# Patient Record
Sex: Male | Born: 1955 | Race: White | Hispanic: No | Marital: Married | Smoking: Former smoker
Health system: Southern US, Community
[De-identification: ages and names within clinical notes are randomized; demographics above are authoritative.]

## PROBLEM LIST (undated history)

## (undated) DIAGNOSIS — I219 Acute myocardial infarction, unspecified: Secondary | ICD-10-CM

---

## 1998-10-25 ENCOUNTER — Encounter: Admission: RE | Admit: 1998-10-25 | Discharge: 1998-11-09 | Payer: Self-pay | Admitting: Family Medicine

## 1999-06-08 ENCOUNTER — Encounter: Payer: Self-pay | Admitting: Emergency Medicine

## 1999-06-08 ENCOUNTER — Emergency Department (HOSPITAL_COMMUNITY): Admission: EM | Admit: 1999-06-08 | Discharge: 1999-06-08 | Payer: Self-pay | Admitting: Emergency Medicine

## 1999-06-29 ENCOUNTER — Ambulatory Visit (HOSPITAL_COMMUNITY): Admission: RE | Admit: 1999-06-29 | Discharge: 1999-06-30 | Payer: Self-pay | Admitting: *Deleted

## 2001-06-27 ENCOUNTER — Encounter: Payer: Self-pay | Admitting: Family Medicine

## 2001-06-27 ENCOUNTER — Encounter: Admission: RE | Admit: 2001-06-27 | Discharge: 2001-06-27 | Payer: Self-pay | Admitting: Family Medicine

## 2001-07-21 ENCOUNTER — Ambulatory Visit (HOSPITAL_COMMUNITY): Admission: RE | Admit: 2001-07-21 | Discharge: 2001-07-22 | Payer: Self-pay | Admitting: Neurosurgery

## 2001-08-19 ENCOUNTER — Encounter: Admission: RE | Admit: 2001-08-19 | Discharge: 2001-08-19 | Payer: Self-pay | Admitting: Neurosurgery

## 2003-03-26 ENCOUNTER — Ambulatory Visit (HOSPITAL_COMMUNITY): Admission: RE | Admit: 2003-03-26 | Discharge: 2003-03-26 | Payer: Self-pay | Admitting: Gastroenterology

## 2003-03-26 ENCOUNTER — Encounter (INDEPENDENT_AMBULATORY_CARE_PROVIDER_SITE_OTHER): Payer: Self-pay | Admitting: *Deleted

## 2007-11-06 ENCOUNTER — Encounter (INDEPENDENT_AMBULATORY_CARE_PROVIDER_SITE_OTHER): Payer: Self-pay | Admitting: *Deleted

## 2007-12-30 ENCOUNTER — Ambulatory Visit: Payer: Self-pay | Admitting: Family Medicine

## 2007-12-30 DIAGNOSIS — F528 Other sexual dysfunction not due to a substance or known physiological condition: Secondary | ICD-10-CM | POA: Insufficient documentation

## 2007-12-30 DIAGNOSIS — R079 Chest pain, unspecified: Secondary | ICD-10-CM

## 2007-12-30 DIAGNOSIS — Z860100 Personal history of colon polyps, unspecified: Secondary | ICD-10-CM | POA: Insufficient documentation

## 2007-12-31 ENCOUNTER — Ambulatory Visit: Payer: Self-pay | Admitting: Family Medicine

## 2007-12-31 ENCOUNTER — Telehealth (INDEPENDENT_AMBULATORY_CARE_PROVIDER_SITE_OTHER): Payer: Self-pay | Admitting: *Deleted

## 2008-01-05 ENCOUNTER — Telehealth (INDEPENDENT_AMBULATORY_CARE_PROVIDER_SITE_OTHER): Payer: Self-pay | Admitting: *Deleted

## 2008-01-05 LAB — CONVERTED CEMR LAB
ALT: 21 units/L (ref 0–53)
AST: 17 units/L (ref 0–37)
Albumin: 4 g/dL (ref 3.5–5.2)
Alkaline Phosphatase: 78 units/L (ref 39–117)
BUN: 14 mg/dL (ref 6–23)
Basophils Absolute: 0 10*3/uL (ref 0.0–0.1)
Basophils Relative: 0.3 % (ref 0.0–1.0)
Bilirubin, Direct: 0.1 mg/dL (ref 0.0–0.3)
CO2: 28 meq/L (ref 19–32)
Calcium: 9.2 mg/dL (ref 8.4–10.5)
Chloride: 109 meq/L (ref 96–112)
Cholesterol: 196 mg/dL (ref 0–200)
Creatinine, Ser: 0.9 mg/dL (ref 0.4–1.5)
Eosinophils Absolute: 0.3 10*3/uL (ref 0.0–0.7)
Eosinophils Relative: 3.9 % (ref 0.0–5.0)
GFR calc Af Amer: 114 mL/min
GFR calc non Af Amer: 94 mL/min
Glucose, Bld: 104 mg/dL — ABNORMAL HIGH (ref 70–99)
HCT: 47 % (ref 39.0–52.0)
HDL: 29.9 mg/dL — ABNORMAL LOW (ref >39.0)
Hemoglobin: 15.6 g/dL (ref 13.0–17.0)
LDL Cholesterol: 147 mg/dL — ABNORMAL HIGH (ref 0–99)
Lymphocytes Relative: 40.7 % (ref 12.0–46.0)
MCHC: 33.1 g/dL (ref 30.0–36.0)
MCV: 91.6 fL (ref 78.0–100.0)
Monocytes Absolute: 0.6 10*3/uL (ref 0.1–1.0)
Monocytes Relative: 7.3 % (ref 3.0–12.0)
Neutro Abs: 4 10*3/uL (ref 1.4–7.7)
Neutrophils Relative %: 47.8 % (ref 43.0–77.0)
PSA: 0.2 ng/mL (ref 0.10–4.00)
Platelets: 272 10*3/uL (ref 150–400)
Potassium: 4.2 meq/L (ref 3.5–5.1)
RBC: 5.13 M/uL (ref 4.22–5.81)
RDW: 12.8 % (ref 11.5–14.6)
Sodium: 142 meq/L (ref 135–145)
TSH: 1.41 microintl units/mL (ref 0.35–5.50)
Total Bilirubin: 1 mg/dL (ref 0.3–1.2)
Total CHOL/HDL Ratio: 6.6
Total Protein: 7 g/dL (ref 6.0–8.3)
Triglycerides: 96 mg/dL (ref 0–149)
VLDL: 19 mg/dL (ref 0–40)
WBC: 8.3 10*3/uL (ref 4.5–10.5)

## 2008-04-12 ENCOUNTER — Ambulatory Visit: Payer: Self-pay | Admitting: Family Medicine

## 2008-04-12 DIAGNOSIS — E785 Hyperlipidemia, unspecified: Secondary | ICD-10-CM | POA: Insufficient documentation

## 2008-04-22 ENCOUNTER — Encounter (INDEPENDENT_AMBULATORY_CARE_PROVIDER_SITE_OTHER): Payer: Self-pay | Admitting: *Deleted

## 2008-04-22 LAB — CONVERTED CEMR LAB
Bilirubin, Direct: 0.1 mg/dL (ref 0.0–0.3)
Calcium: 9.3 mg/dL (ref 8.4–10.5)
GFR calc Af Amer: 90 mL/min
GFR calc non Af Amer: 74 mL/min
HDL: 29.4 mg/dL — ABNORMAL LOW (ref >39.0)
Sodium: 141 meq/L (ref 135–145)
Total Bilirubin: 1.4 mg/dL — ABNORMAL HIGH (ref 0.3–1.2)
Total CHOL/HDL Ratio: 7.1
Triglycerides: 130 mg/dL (ref 0–149)

## 2008-05-03 ENCOUNTER — Emergency Department (HOSPITAL_COMMUNITY): Admission: EM | Admit: 2008-05-03 | Discharge: 2008-05-03 | Payer: Self-pay | Admitting: Emergency Medicine

## 2008-05-13 ENCOUNTER — Emergency Department (HOSPITAL_COMMUNITY): Admission: EM | Admit: 2008-05-13 | Discharge: 2008-05-13 | Payer: Self-pay | Admitting: Emergency Medicine

## 2008-09-21 ENCOUNTER — Encounter: Payer: Self-pay | Admitting: Family Medicine

## 2009-05-14 IMAGING — CR DG CHEST 2V
3 series · 3 of 3 positions shown · non-contrast
Comparison: None.

CLINICAL DATA: Left rib pain, history of smoking

CHEST - 2 VIEW

[view not recorded (1 of 3)]
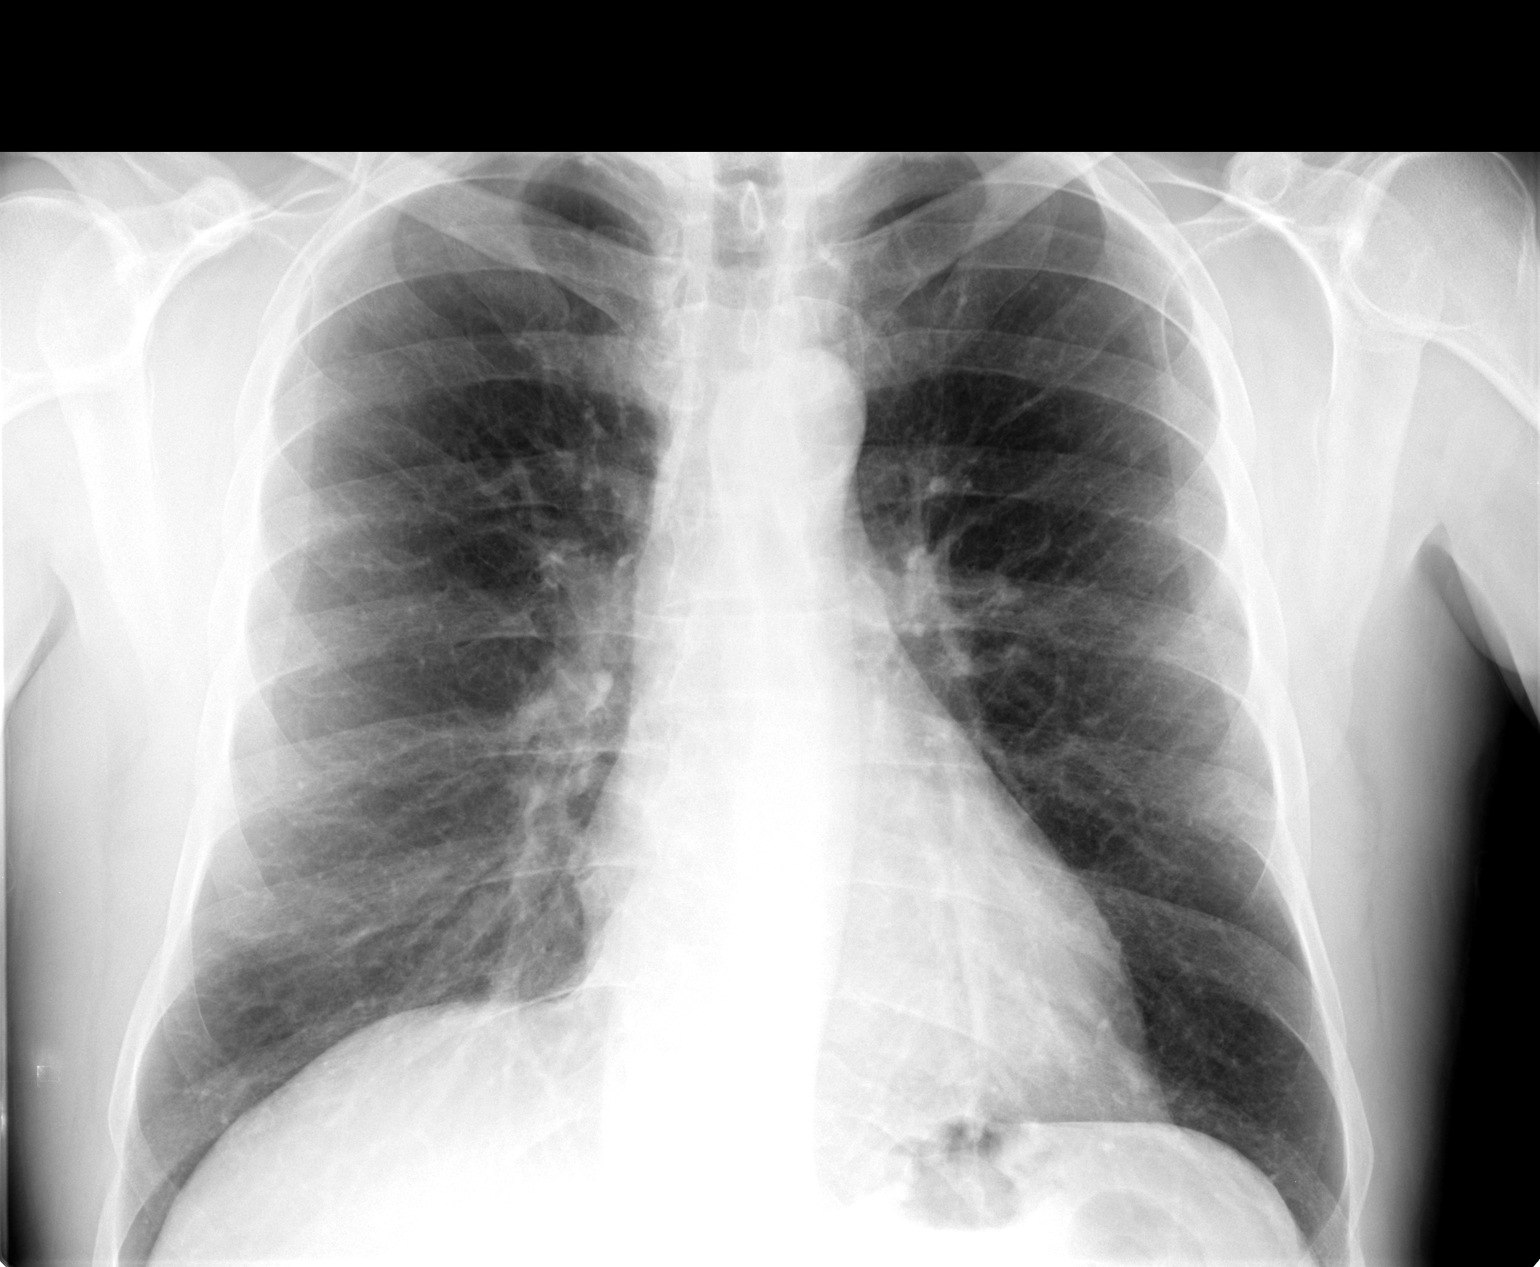

[view not recorded (2 of 3)]
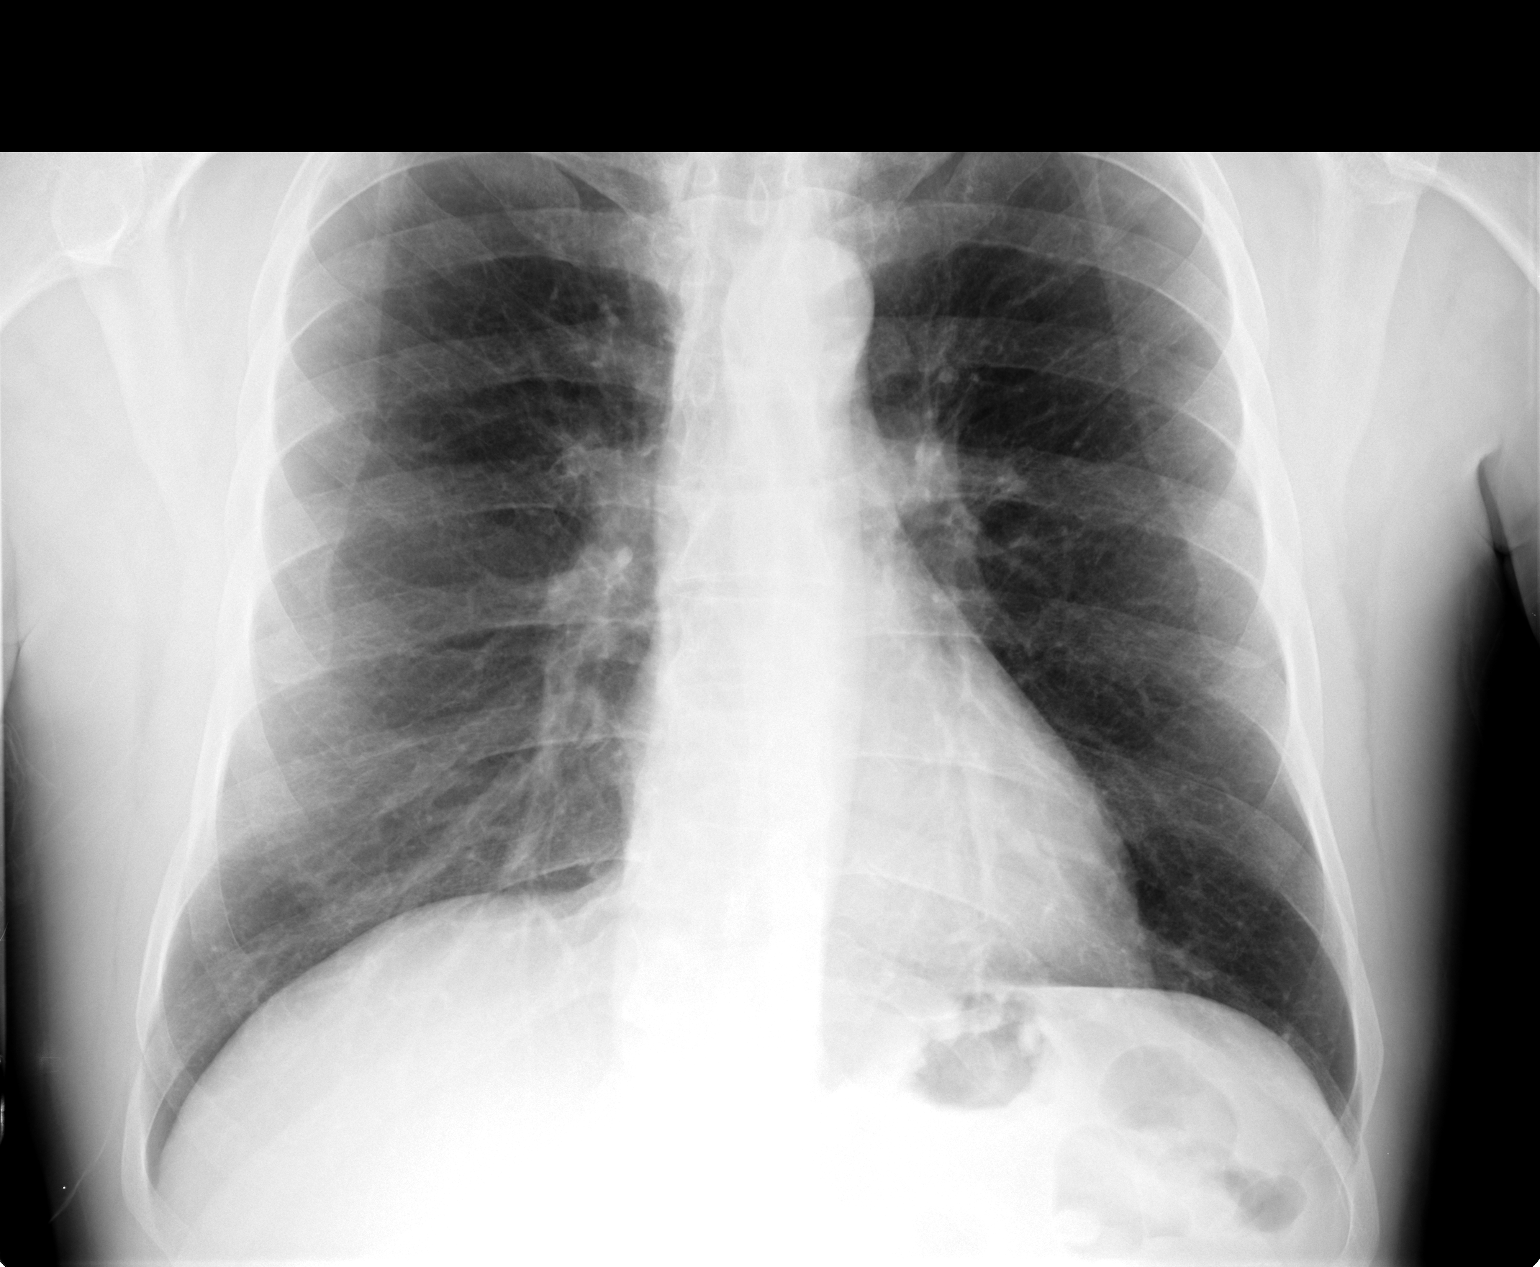

[view not recorded (3 of 3)]
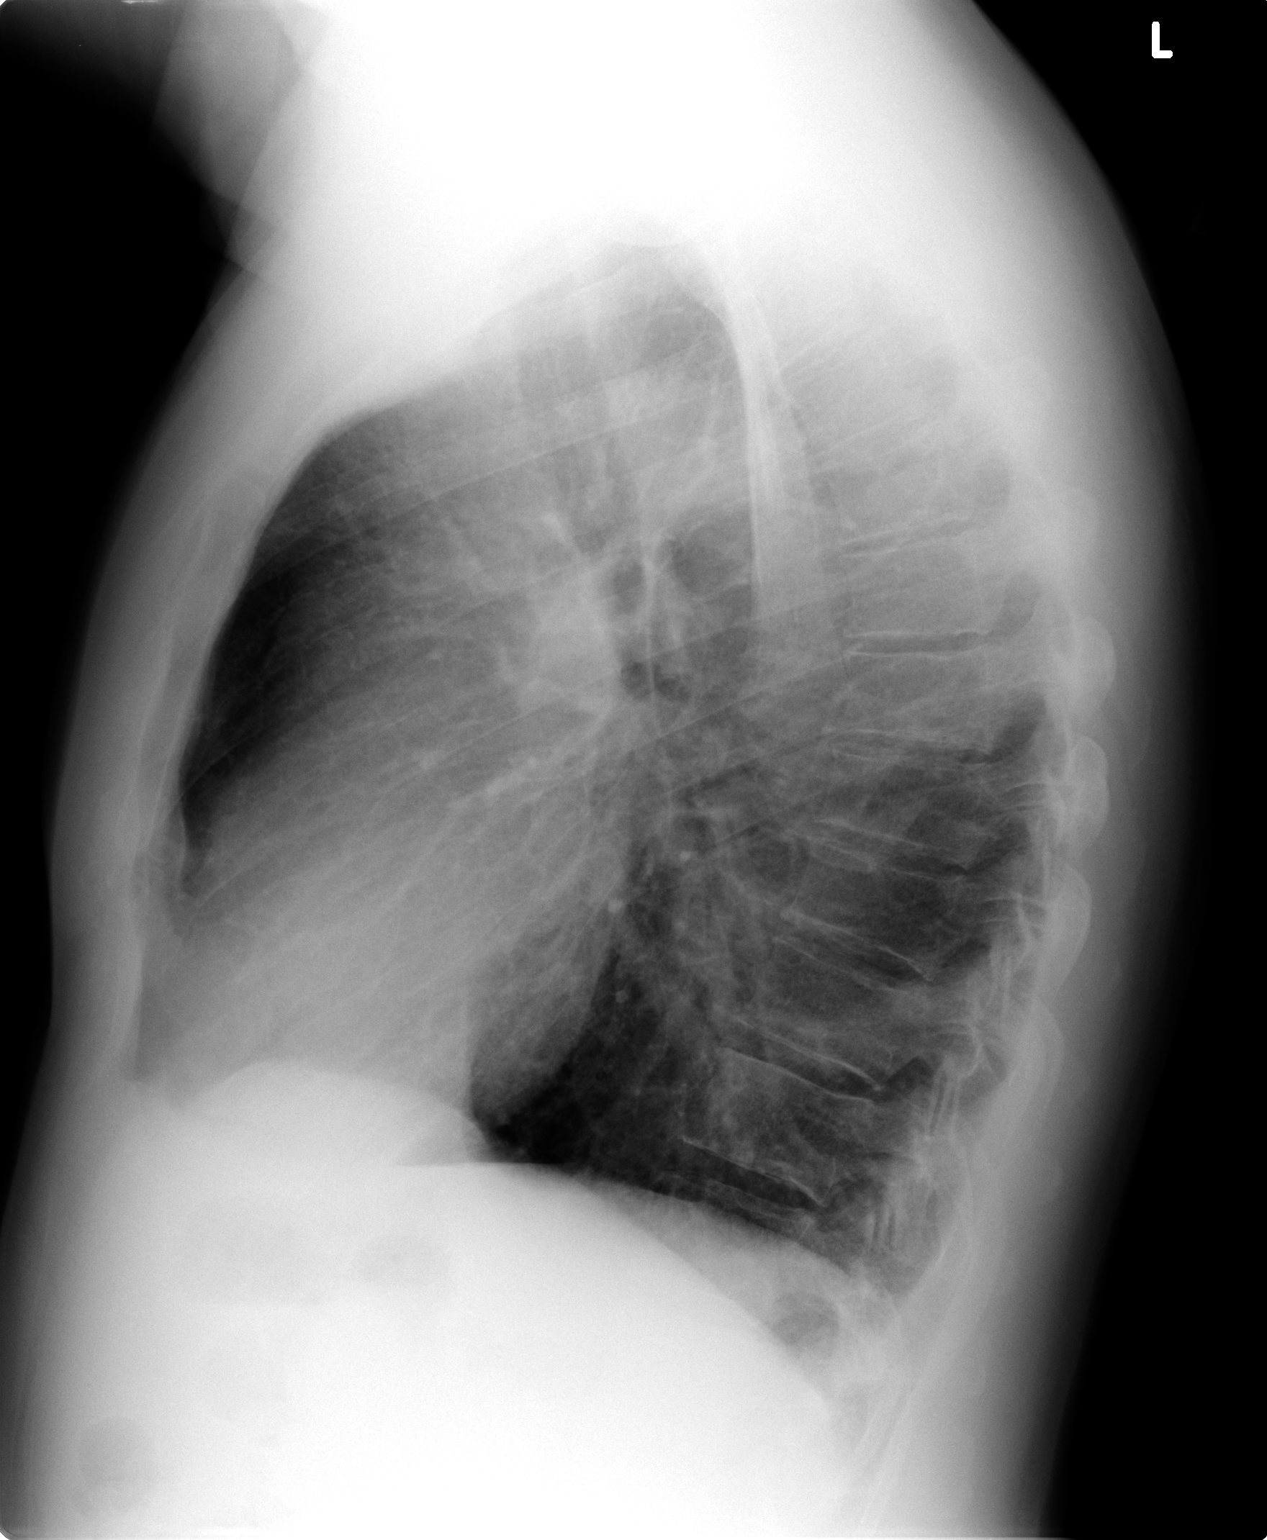

[3 of 3 positions shown; findings below may reference images not displayed]

FINDINGS: Normal heart size and vascularity.  No acute pneumonia,
consolidation, collapse, airspace process, edema, effusion or
pneumothorax.  Trachea is midline.  Lower cervical fusion hardware
is partially imaged.  No subcutaneous air.  Intact bony thorax.
IMPRESSION: No acute chest findings.

## 2011-02-09 NOTE — Op Note (Signed)
[place]  Patient:    Devon Short, Devon Short Visit Number: [id number] MRN: [id number]          Service Type: SUR Location: [id number] Attending Physician:  Cristi Loron Dictated by:   Cristi Loron, M.D. Proc. Date: 07/21/01 Admit Date:  07/21/2001                             Operative Report  PREOPERATIVE DIAGNOSES:  C5-6 spondylosis, degenerative disk disease, spinal stenosis, cervical radiculopathy, cervicalgia.  POSTOPERATIVE DIAGNOSES:  C5-6 spondylosis, degenerative disk disease, spinal stenosis, cervical radiculopathy, cervicalgia.  PROCEDURES:  C5-6 extensive anterior cervical diskectomy, interbody iliac crest allograft arthrodesis, anterior cervical plating (Codman titanium plate and screws).  SURGEON:  Cristi Loron, M.D.  ASSISTANT:  Hewitt Shorts, M.D.  ANESTHESIA:  General endotracheal.  ESTIMATED BLOOD LOSS:  100 cc.  SPECIMENS:  None.  DRAINS:  None.  COMPLICATIONS:  None.  BRIEF HISTORY:  The patient is a 55 year old white male who has had chronic neck pain.  He began having more severe pain over the last several months, and he failed medical management and was worked up as an outpatient with a cervical MRI, which demonstrated significant cervical spondylosis at C5-6. The patient therefore weighed the risks, benefits, and alternatives of surgery and decided to proceed with an anterior cervical diskectomy and fusion and plating at C5-6.  DESCRIPTION OF PROCEDURE:  The patient was brought to the operating room by the anesthesia team.  General endotracheal anesthesia was induced.  The patient remained in supine position.  A roll was placed under his shoulders to place the neck in slight extension.  His anterior cervical region was then shaved and prepared with Betadine scrub and Betadine solution, and sterile drapes were applied.  I then injected the area to be incised with Marcaine with  epinephrine solution and used a scalpel to make a transverse incision in the patients left anterior neck.  I used the Metzenbaum scissors to divide the platysma muscle and divide medial to the sternocleidomastoid muscle, jugular vein, and carotid artery.  I bluntly dissected down toward the anterior cervical spine, identified the esophagus, and carefully retracted it medially.  I cleared the soft tissue from the anterior cervical spine using Kitner swabs and inserted a bent spinal needle into the exposed interspace.  I then obtained the intraoperative radiograph to confirm my location.  The needle was in the C5-6.  I then removed the needle and used electrocautery to detach the medial border of the longus colli muscle bilaterally from the C5-6 interspace.  I inserted the Caspar self-retaining retractor for exposure and then used a 15 blade scalpel to incise the C5-6 intervertebral disk.  The disk space was quite collapsed.  I then inserted distraction screws at C5 and C6 and distracted the interspace, and using pituitary forceps and Karlin curettes I performed a partial diskectomy at C5-6.  I used the high-speed drill to decorticate the vertebral end plates at Y7-8 and drill away the remainder of the intervertebral disk.  There were large osteophytes coming from the vertebral end plates.  I drilled them away with the drill.  I thinned out the posterior longitudinal ligament with the drill and incised it with the arachnoid knife and removed it with the Kerrison punch, undercutting the vertebral end plates at G9-5, decompressing the thecal sac.  I performed a generous foraminotomy about the bilateral C6 nerve root.  Of note, on the left side there was a large bone spur compressing the left C6 nerve root.  The right nerve root was not quite as compressed.  I got a good decompression of the thecal sac and the bilateral C6 nerve roots.  I now turned my attention to the arthrodesis.  I obtained  iliac crest tricortical allograft bone graft and fashioned it to these approximate dimensions:  8 mm in height, 1 cm in depth.  I inserted it into the distracted C5-6 interspace and then removed the distraction screws.  There was good, snug fit of the bone graft in the interspace.  I now turned my attention to the anterior spinal instrumentation.  I obtained the appropriate length Codman anterior cervical plate, laid it along the vertebral bodies from C5 to C6, drilled two holes at C5 and two at C6, tapped the holes, and secured the plate to the vertebral bodies with 15 mm screws, i.e., two at each vertebral body.  I then obtained the intraoperative radiograph that demonstrated good position and depth of screws.  I could not see the lower screws because of the patients shoulders, but they looked good in vivo.  I therefore secured the screws to the plate using the cam tightener at each screw.  I then achieved stringent hemostasis using bipolar electrocautery and Gelfoam.  I then copiously irrigated out the wound with bacitracin solution, removed the solution, and then removed the Caspar self-retaining retractor.  I inspected the esophagus for any damage, and there was none apparent.  I then reapproximated the patients platysma muscle with interrupted 3-0 Vicryl suture, the subcutaneous tissue with interrupted 3-0 Vicryl suture, and the skin with Steri-Strips and benzoin.  The wound was then covered with bacitracin ointment and a sterile dressing applied, the drapes were removed, and the patient was subsequently extubated by the anesthesia team and transported to the postanesthesia care unit in stable condition.  All sponge, instrument, and needle counts were correct at the end of the case. Dictated by:   Cristi Loron, M.D. Attending Physician:  Tressie Stalker D DD:  07/21/01 TD:  07/22/01 Job: [id number]

## 2011-02-09 NOTE — Op Note (Signed)
   NAME:  Devon Short, Devon Short                        ACCOUNT NO.:  [id number]   MEDICAL RECORD NO.:  [id number]                   PATIENT TYPE:  AMB   LOCATION:  ENDO                                 FACILITY:  [facility]   PHYSICIAN:  Anselmo Rod, M.D.               DATE OF BIRTH:  1956-04-23   DATE OF PROCEDURE:  03/26/2003  DATE OF DISCHARGE:                                 OPERATIVE REPORT   PROCEDURE PERFORMED:  Colonoscopy with biopsies.   ENDOSCOPIST:  Anselmo Rod, M.D.   INSTRUMENT USED:  Olympus video colonoscope.   INDICATION FOR PROCEDURE:  A 55 year old white male with a history of rectal  bleeding for the last six months.  Rule out colonic polyps, masses,  hemorrhoids, etc.   PREPROCEDURE PREPARATION:  Informed consent was procured from the patient.  The patient had fasted for eight hours prior to the procedure and prepped  with a bottle of magnesium citrate and a gallon of GoLYTELY the night prior  to the procedure.   PREPROCEDURE PHYSICAL:  VITAL SIGNS:  The patient had stable vital signs.  NECK:  Supple.  CHEST:  Clear to auscultation.  S1, S2 regular.  ABDOMEN:  Soft with normal bowel sounds.   DESCRIPTION OF PROCEDURE:  The patient was placed in the left lateral  decubitus position and sedated with 80 mg of Demerol and 10 mg of Versed  intravenously.  Once the patient adequately sedate and maintained on low-  flow oxygen and continuous cardiac monitoring, the Olympus video colonoscope  was advanced from the rectum to the cecum.  The appendiceal orifice and the  ileocecal valve were clearly visualized and photographed.  The terminal  ileum appeared normal.  A few small sessile polyps were biopsied from the  rectosigmoid area.  Small internal hemorrhoids were seen on retroflexion,  and the rest of the exam was essentially normal.   IMPRESSION:  1. Small, nonbleeding internal hemorrhoids.  2. A few sessile polyps biopsied from the rectosigmoid area.  3.  Otherwise normal colonoscopy up to the terminal ileum.   RECOMMENDATIONS:  1. Await pathology results.  2.     Avoid all nonsteroidals including aspirin for the next four weeks.  3. A high-fiber diet with liberal fluid intake.  4. Outpatient follow-up in the next two weeks for further recommendations.                                               Anselmo Rod, M.D.    JNM/MEDQ  D:  03/26/2003  T:  03/27/2003  Job:  [id number]   cc:   Gabriel Earing, M.D.  [address]  [place]  [place] [address]  Fax: [phone]

## 2020-01-05 ENCOUNTER — Other Ambulatory Visit: Payer: Self-pay

## 2020-01-05 ENCOUNTER — Emergency Department (INDEPENDENT_AMBULATORY_CARE_PROVIDER_SITE_OTHER): Payer: BC Managed Care – PPO

## 2020-01-05 ENCOUNTER — Emergency Department (INDEPENDENT_AMBULATORY_CARE_PROVIDER_SITE_OTHER)
Admission: EM | Admit: 2020-01-05 | Discharge: 2020-01-05 | Disposition: A | Discharge status: Home / Self Care | Payer: BC Managed Care – PPO | Source: Home / Self Care

## 2020-01-05 ENCOUNTER — Encounter: Payer: Self-pay | Admitting: Emergency Medicine

## 2020-01-05 DIAGNOSIS — J209 Acute bronchitis, unspecified: Secondary | ICD-10-CM

## 2020-01-05 HISTORY — DX: Acute myocardial infarction, unspecified: I21.9

## 2020-01-05 MED ORDER — AZITHROMYCIN 250 MG PO TABS: 250.0000 mg | ORAL_TABLET | Freq: Every day | ORAL | 0 refills | Status: DC

## 2020-01-05 MED ORDER — BENZONATATE 100 MG PO CAPS: 100.0000 mg | ORAL_CAPSULE | Freq: Three times a day (TID) | ORAL | 0 refills | Status: DC

## 2020-01-05 NOTE — ED Provider Notes (Addendum)
Devon Short CARE    CSN: [id number] Arrival date & time: 01/05/20  1811      History   Chief Complaint Chief Complaint  Patient presents with  . Cough    HPI [name] is a 64 y.o. male.   HPI Devon Short is a 64 y.o. male presenting to UC with c/o 1 week gradually worsening productive cough with thick green mucous.  Mild chest soreness, body aches and fatigue.  He had a negative Covid test a few days ago.  Denies known sick contacts or recent travel.  No known fever.  No SOB. Denies n/v/d.  BP elevated, hx of same. Last BP with cardiologist 1 month ago was 178/91. Pt's BP medication was recently changed. O2 Sat 94% today, it was 95% about 1 month ago with his cardiologist per [place].   Past Medical History:  Diagnosis Date  . Heart attack [facility])     Patient Active Problem List   Diagnosis Date Noted  . OTHER AND UNSPECIFIED HYPERLIPIDEMIA 04/12/2008  . ERECTILE DYSFUNCTION 12/30/2007  . RIB PAIN, LEFT SIDED 12/30/2007  . COLONIC POLYPS, BENIGN, HX OF 12/30/2007    History reviewed. No pertinent surgical history.    Home Medications    Prior to Admission medications   Medication Sig Start Date End Date Taking? Authorizing Provider  aspirin 81 MG chewable tablet Chew by mouth daily.   Yes [provider]  atorvastatin (LIPITOR) 40 MG tablet Take 40 mg by mouth daily.   Yes [provider]  carvedilol (COREG) 12.5 MG tablet Take 12.5 mg by mouth 2 (two) times daily with a meal.   Yes [provider]  isosorbide mononitrate (IMDUR) 60 MG 24 hr tablet Take 60 mg by mouth daily.   Yes [provider]  azithromycin (ZITHROMAX) 250 MG tablet Take 1 tablet (250 mg total) by mouth daily. Take first 2 tablets together, then 1 every day until finished. 01/05/20   Noe Gens, PA-C  benzonatate (TESSALON) 100 MG capsule Take 1-2 capsules (100-200 mg total) by mouth every 8 (eight) hours. 01/05/20   Noe Gens,  PA-C    Family History Family History  Problem Relation Age of Onset  . Cancer Father     Social History Social History   Tobacco Use  . Smoking status: Former Smoker    Years: 45.00    Types: Cigarettes    Quit date: 01/04/2018    Years since quitting: 2.0  . Smokeless tobacco: Never Used  Substance Use Topics  . Alcohol use: Not Currently  . Drug use: Never     Allergies   Patient has no known allergies.   Review of Systems Review of Systems  Constitutional: Positive for fatigue. Negative for chills and fever.  HENT: Positive for congestion. Negative for ear pain, sore throat, trouble swallowing and voice change.   Respiratory: Positive for cough. Negative for shortness of breath.   Cardiovascular: Negative for chest pain and palpitations.  Gastrointestinal: Negative for abdominal pain, diarrhea, nausea and vomiting.  Musculoskeletal: Positive for arthralgias, back pain and myalgias.  Skin: Negative for rash.  Neurological: Positive for headaches. Negative for dizziness and light-headedness.  All other systems reviewed and are negative.    Physical Exam Triage Vital Signs ED Triage Vitals  Enc Vitals Group     BP 01/05/20 1825 (!) 192/105     Pulse Rate 01/05/20 1825 (!) 53     Resp 01/05/20 1825 10  Temp 01/05/20 1825 98.4 F (36.9 C)     Temp Source 01/05/20 1825 Oral     SpO2 01/05/20 1825 94 %     Weight 01/05/20 1827 202 lb (91.6 kg)     Height 01/05/20 1827 5\' 11"  (1.803 m)     Head Circumference --      Peak Flow --      Pain Score 01/05/20 1826 2     Pain Loc --      Pain Edu? --      Excl. in GC? --    No data found.  Updated Vital Signs BP (!) 192/105 (BP Location: Right Arm)   Pulse (!) 53   Temp 98.4 F (36.9 C) (Oral)   Resp 10   Ht 5\' 11"  (1.803 m)   Wt 202 lb (91.6 kg)   SpO2 94%   BMI 28.17 kg/m   Visual Acuity Right Eye Distance:   Left Eye Distance:   Bilateral Distance:    Right Eye Near:   Left Eye Near:      Bilateral Near:     Physical Exam Vitals and nursing note reviewed.  Constitutional:      Appearance: Normal appearance. He is well-developed.  HENT:     Head: Normocephalic and atraumatic.     Right Ear: Tympanic membrane and ear canal normal.     Left Ear: Tympanic membrane and ear canal normal.     Nose: Nose normal.     Right Sinus: No maxillary sinus tenderness or frontal sinus tenderness.     Left Sinus: No maxillary sinus tenderness or frontal sinus tenderness.     Mouth/Throat:     Lips: Pink.     Mouth: Mucous membranes are moist.     Pharynx: Oropharynx is clear. Uvula midline.  Cardiovascular:     Rate and Rhythm: Normal rate and regular rhythm.  Pulmonary:     Effort: Pulmonary effort is normal. No respiratory distress.     Breath sounds: No stridor. Wheezing ( in lower lung fields) and rhonchi present.  Musculoskeletal:        General: Normal range of motion.     Cervical back: Normal range of motion.  Skin:    General: Skin is warm and dry.  Neurological:     Mental Status: He is alert and oriented to person, place, and time.  Psychiatric:        Behavior: Behavior normal.      UC Treatments / Results  Labs (all labs ordered are listed, but only abnormal results are displayed) Labs Reviewed - No data to display  EKG   Radiology .CLINICAL DATA:  Cough, congestion. Additional history provided: Patient reports cough productive of green sputum and shortness of breath for 1 week.  EXAM: CHEST - 2 VIEW  COMPARISON:  Chest radiograph 12/31/2007  FINDINGS: Heart size within normal limits. Aortic atherosclerosis. Minimal linear atelectasis within the lung bases. No definite airspace consolidation. No evidence of pleural effusion or pneumothorax. No acute bony abnormality. Partially visualized cervical spinal fusion hardware.  IMPRESSION: Minimal linear atelectasis within the lung bases. No definite airspace consolidation.  Aortic  atherosclerosis.   Electronically Signed   By: DO   On: 01/05/2020 19:26  Procedures Procedures (including critical care time)  Medications Ordered in UC Medications - No data to display  Initial Impression / Assessment and Plan / UC Course  I have reviewed the triage vital signs and the nursing notes.  Pertinent  labs & imaging results that were available during my care of the patient were reviewed by me and considered in my medical decision making (see chart for details).     Hx and exam c/w acute bronchitis.  Encouraged to f/u with PCP for BP monitoring  And recheck of symptoms next week if not improving. AVS provided  Final Clinical Impressions(s) / UC Diagnoses   Final diagnoses:  Acute bronchitis, unspecified organism     Discharge Instructions      You may take 500mg  acetaminophen every 4-6 hours or in combination with ibuprofen 400-600mg  every 6-8 hours as needed for pain, inflammation, and fever.  Be sure to well hydrated with clear liquids and get at least 8 hours of sleep at night, preferably more while sick.   Please follow up with family medicine in 1 week if needed.  Please take antibiotics as prescribed and be sure to complete entire course even if you start to feel better to ensure infection does not come back.     ED Prescriptions    Medication Sig Dispense Auth. Provider   azithromycin (ZITHROMAX) 250 MG tablet  (Status: Discontinued) Take 1 tablet (250 mg total) by mouth daily. Take first 2 tablets together, then 1 every day until finished. 6 tablet , Jesaiah Fabiano O, PA-C   benzonatate (TESSALON) 100 MG capsule  (Status: Discontinued) Take 1-2 capsules (100-200 mg total) by mouth every 8 (eight) hours. 21 capsule 05-06-1987, Lilli Dewald O, PA-C   azithromycin (ZITHROMAX) 250 MG tablet Take 1 tablet (250 mg total) by mouth daily. Take first 2 tablets together, then 1 every day until finished. 6 tablet Doroteo Glassman, Sandip Power O, PA-C   benzonatate (TESSALON)  100 MG capsule Take 1-2 capsules (100-200 mg total) by mouth every 8 (eight) hours. 21 capsule 05-06-1987, Lurene Shadow     PDMP not reviewed this encounter.   [place], PA-C 01/08/20 0859    01/10/20, PA-C 01/08/20 [phone]

## 2020-01-05 NOTE — Discharge Instructions (Signed)
You may take 500mg acetaminophen every 4-6 hours or in combination with ibuprofen 400-600mg every 6-8 hours as needed for pain, inflammation, and fever. ° °Be sure to well hydrated with clear liquids and get at least 8 hours of sleep at night, preferably more while sick.  ° °Please follow up with family medicine in 1 week if needed. ° °Please take antibiotics as prescribed and be sure to complete entire course even if you start to feel better to ensure infection does not come back. ° °

## 2020-01-05 NOTE — ED Triage Notes (Signed)
Productive cough x 1 week, green mucus

## 2020-08-10 ENCOUNTER — Other Ambulatory Visit: Payer: Self-pay

## 2020-08-10 ENCOUNTER — Emergency Department (INDEPENDENT_AMBULATORY_CARE_PROVIDER_SITE_OTHER)
Admission: EM | Admit: 2020-08-10 | Discharge: 2020-08-10 | Disposition: A | Discharge status: Home / Self Care | Payer: BC Managed Care – PPO | Source: Home / Self Care | Attending: Family Medicine | Admitting: Family Medicine

## 2020-08-10 DIAGNOSIS — J069 Acute upper respiratory infection, unspecified: Secondary | ICD-10-CM | POA: Diagnosis not present

## 2020-08-10 MED ORDER — AZITHROMYCIN 250 MG PO TABS: 250.0000 mg | ORAL_TABLET | Freq: Every day | ORAL | 0 refills | Status: AC

## 2020-08-10 MED ORDER — GUAIFENESIN-CODEINE 100-10 MG/5ML PO SOLN: ORAL | 0 refills | Status: DC

## 2020-08-10 NOTE — ED Triage Notes (Signed)
Pt co sore throat with productive cough  and sinus drainage since Saturday. Pt has taken decongestant with no relief.

## 2020-08-10 NOTE — ED Provider Notes (Signed)
Devon Short    CSN: [id number] Arrival date & time: 08/10/20  1439      History   Chief Complaint Chief Complaint  Patient presents with  . Sore Throat    HPI Devon Short is a 64 y.o. male.   Four days ago patient developed sore throat, productive cough, fatigue, and sinus drainage.  He has felt tightness in his anterior chest but denies shortness of breath or pleuritic pain.  He denies fevers, chills, and sweats.  The history is provided by the patient.    Past Medical History:  Diagnosis Date  . Heart attack [facility])     Patient Active Problem List   Diagnosis Date Noted  . OTHER AND UNSPECIFIED HYPERLIPIDEMIA 04/12/2008  . ERECTILE DYSFUNCTION 12/30/2007  . RIB PAIN, LEFT SIDED 12/30/2007  . COLONIC POLYPS, BENIGN, HX OF 12/30/2007    History reviewed. No pertinent surgical history.     Home Medications    Prior to Admission medications   Medication Sig Start Date End Date Taking? Authorizing Provider  aspirin 81 MG chewable tablet Chew by mouth daily.    [provider]  atorvastatin (LIPITOR) 40 MG tablet Take 40 mg by mouth daily.    [provider]  azithromycin (ZITHROMAX) 250 MG tablet Take 1 tablet (250 mg total) by mouth daily for 6 days. Take first 2 tablets together, then 1 every day until finished. 08/10/20 08/16/20  Lattie Haw, MD  carvedilol (COREG) 12.5 MG tablet Take 12.5 mg by mouth 2 (two) times daily with a meal.    [provider]  guaiFENesin-codeine 100-10 MG/5ML syrup Take 61mL by mouth at bedtime as needed for cough. 08/10/20   Lattie Haw, MD  isosorbide mononitrate (IMDUR) 60 MG 24 hr tablet Take 60 mg by mouth daily.    [provider]  Omega-3 1000 MG CAPS Take by mouth.    [provider]    Family History Family History  Problem Relation Age of Onset  . Cancer Father     Social History Social History   Tobacco Use  . Smoking status: Former Smoker     Years: 45.00    Types: Cigarettes    Quit date: 01/04/2018    Years since quitting: 2.6  . Smokeless tobacco: Never Used  Vaping Use  . Vaping Use: Never used  Substance Use Topics  . Alcohol use: Not Currently  . Drug use: Never     Allergies   Patient has no known allergies.   Review of Systems Review of Systems + sore throat + cough No pleuritic pain No wheezing + nasal congestion + post-nasal drainage No sinus pain/pressure No itchy/red eyes No earache No hemoptysis No SOB No fever/chills No nausea No vomiting No abdominal pain No diarrhea No urinary symptoms No skin rash + fatigue No myalgias No headache Used OTC meds (Mucinex) without relief   Physical Exam Triage Vital Signs ED Triage Vitals  Enc Vitals Group     BP 08/10/20 1508 (!) 170/84     Pulse Rate 08/10/20 1508 (!) 59     Resp 08/10/20 1508 15     Temp 08/10/20 1508 98.6 F (37 C)     Temp Source 08/10/20 1508 Oral     SpO2 08/10/20 1508 99 %     Weight --      Height --      Head Circumference --      Peak Flow --  Pain Score 08/10/20 1505 0     Pain Loc --      Pain Edu? --      Excl. in GC? --    No data found.  Updated Vital Signs BP (!) 165/88   Pulse (!) 59   Temp 98.6 F (37 C) (Oral)   Resp 15   SpO2 99%   Visual Acuity Right Eye Distance:   Left Eye Distance:   Bilateral Distance:    Right Eye Near:   Left Eye Near:    Bilateral Near:     Physical Exam Nursing notes and Vital Signs reviewed. Appearance:  Patient appears stated age, and in no acute distress Eyes:  Pupils are equal, round, and reactive to light and accomodation.  Extraocular movement is intact.  Conjunctivae are not inflamed  Ears:  Canals normal.  Tympanic membranes normal.  Nose:  Congested turbinates.  No sinus tenderness.  Pharynx:  Normal Neck:  Supple.  Mildly enlarged lateral nodes are present, tender to palpation on the left.   Lungs:  Clear to auscultation.  Breath sounds are  equal.  Moving air well. Heart:  Regular rate and rhythm without murmurs, rubs, or gallops.  Abdomen:  Nontender without masses or hepatosplenomegaly.  Bowel sounds are present.  No CVA or flank tenderness.  Extremities:  No edema.  Skin:  No rash present.   UC Treatments / Results  Labs (all labs ordered are listed, but only abnormal results are displayed) Labs Reviewed - No data to display  EKG   Radiology No results found.  Procedures Procedures (including critical Short time)  Medications Ordered in UC Medications - No data to display  Initial Impression / Assessment and Plan / UC Course  I have reviewed the triage vital signs and the nursing notes.  Pertinent labs & imaging results that were available during my Short of the patient were reviewed by me and considered in my medical decision making (see chart for details).    Patient is a former smoker.  Note basilar linear atelectasis on chest x-ray done 01/05/20. Begin Z-pack. Rx for Robitussin AC for night time cough.  Followup with Family Doctor if not improved in about 10 days.   Final Clinical Impressions(s) / UC Diagnoses   Final diagnoses:  Viral URI with cough     Discharge Instructions     Take plain guaifenesin (1200mg  extended release tabs such as Mucinex) twice daily, with plenty of water, for cough and congestion. Get adequate rest.   May use Afrin nasal spray (or generic oxymetazoline) each morning for about 5 days and then discontinue.  Also recommend using saline nasal spray several times daily and saline nasal irrigation (AYR is a common brand).  Use Flonase nasal spray each morning after using Afrin nasal spray and saline nasal irrigation. Try warm salt water gargles for sore throat.  Stop all antihistamines for now, and other non-prescription cough/cold preparations.      ED Prescriptions    Medication Sig Dispense Auth. Provider   azithromycin (ZITHROMAX) 250 MG tablet Take 1 tablet (250 mg  total) by mouth daily for 6 days. Take first 2 tablets together, then 1 every day until finished. 6 tablet , MD   guaiFENesin-codeine 100-10 MG/5ML syrup Take 51mL by mouth at bedtime as needed for cough. 50 mL 9m, MD        Lattie Haw, MD 08/15/20 [phone]

## 2020-08-10 NOTE — Discharge Instructions (Addendum)
Take plain guaifenesin (1200mg extended release tabs such as Mucinex) twice daily, with plenty of water, for cough and congestion.  Get adequate rest.   °May use Afrin nasal spray (or generic oxymetazoline) each morning for about 5 days and then discontinue.  Also recommend using saline nasal spray several times daily and saline nasal irrigation (AYR is a common brand).  Use Flonase nasal spray each morning after using Afrin nasal spray and saline nasal irrigation. °Try warm salt water gargles for sore throat.  °Stop all antihistamines for now, and other non-prescription cough/cold preparations. °  °  °

## 2021-01-13 ENCOUNTER — Other Ambulatory Visit: Payer: Self-pay

## 2021-01-13 ENCOUNTER — Encounter: Payer: Self-pay | Admitting: Emergency Medicine

## 2021-01-13 ENCOUNTER — Emergency Department (INDEPENDENT_AMBULATORY_CARE_PROVIDER_SITE_OTHER)
Admission: EM | Admit: 2021-01-13 | Discharge: 2021-01-13 | Disposition: A | Discharge status: Home / Self Care | Payer: BC Managed Care – PPO | Source: Home / Self Care | Attending: Family Medicine | Admitting: Family Medicine

## 2021-01-13 DIAGNOSIS — J069 Acute upper respiratory infection, unspecified: Secondary | ICD-10-CM

## 2021-01-13 DIAGNOSIS — J029 Acute pharyngitis, unspecified: Secondary | ICD-10-CM

## 2021-01-13 DIAGNOSIS — H6121 Impacted cerumen, right ear: Secondary | ICD-10-CM

## 2021-01-13 LAB — POCT RAPID STREP A (OFFICE): Rapid Strep A Screen: NEGATIVE

## 2021-01-13 MED ORDER — DOXYCYCLINE HYCLATE 100 MG PO CAPS: ORAL_CAPSULE | ORAL | 0 refills | Status: DC

## 2021-01-13 NOTE — ED Triage Notes (Signed)
Sore throat x 3 days Vaccinated 

## 2021-01-13 NOTE — Discharge Instructions (Addendum)
Take plain guaifenesin (1200mg  extended release tabs such as Mucinex) twice daily, with plenty of water, for cough and congestion.  Get adequate rest.   May use Afrin nasal spray (or generic oxymetazoline) each morning for about 5 days and then discontinue.  Also recommend using saline nasal spray several times daily and saline nasal irrigation (AYR is a common brand).  Use Flonase nasal spray each morning after using Afrin nasal spray and saline nasal irrigation. Try warm salt water gargles for sore throat.  Stop all antihistamines for now, and other non-prescription cough/cold preparations. May take Delsym Cough Suppressant ("12 Hour Cough Relief") at bedtime for nighttime cough.  Begin Doxycycline if not improving about one week or if persistent fever develops    If your COVID-19 test is positive, isolate yourself for five days from the date of testing. At the end of five days you may end isolation if your symptoms have cleared or improved, and you have not had a fever for 24 hours. At this time you should wear a mask for five more days when you are around others.

## 2021-01-13 NOTE — ED Provider Notes (Signed)
[facility]    CSN: [id number] Arrival date & time: 01/13/21  1710      History   Chief Complaint Chief Complaint  Patient presents with  . Sore Throat    HPI Devon Short is a 65 y.o. male.   Three days ago patient developed a sore throat and hoarseness, followed by sinus congestion and fatigue.  He has now developed a cough.  He denies pleuritic pain, shortness of breath, and fevers, chills, and sweats.  He notes that his right ear feels clogged.  The history is provided by the patient.    Past Medical History:  Diagnosis Date  . Heart attack [facility])     Patient Active Problem List   Diagnosis Date Noted  . OTHER AND UNSPECIFIED HYPERLIPIDEMIA 04/12/2008  . ERECTILE DYSFUNCTION 12/30/2007  . RIB PAIN, LEFT SIDED 12/30/2007  . COLONIC POLYPS, BENIGN, HX OF 12/30/2007    History reviewed. No pertinent surgical history.     Home Medications    Prior to Admission medications   Medication Sig Start Date End Date Taking? Authorizing Provider  doxycycline (VIBRAMYCIN) 100 MG capsule Take one cap PO Q12hr with food. 01/13/21  Yes Lattie Haw, MD  lisinopril (ZESTRIL) 40 MG tablet Take 40 mg by mouth daily.   Yes [provider]  aspirin 81 MG chewable tablet Chew by mouth daily.    [provider]  atorvastatin (LIPITOR) 40 MG tablet Take 40 mg by mouth daily.    [provider]  carvedilol (COREG) 12.5 MG tablet Take 12.5 mg by mouth 2 (two) times daily with a meal.    [provider]  isosorbide mononitrate (IMDUR) 60 MG 24 hr tablet Take 60 mg by mouth daily.    [provider]  Omega-3 1000 MG CAPS Take by mouth.    [provider]    Family History Family History  Problem Relation Age of Onset  . Cancer Father     Social History Social History   Tobacco Use  . Smoking status: Former Smoker    Years: 45.00    Types: Cigarettes    Quit date: 01/04/2018    Years since quitting: 3.0   . Smokeless tobacco: Never Used  Vaping Use  . Vaping Use: Never used  Substance Use Topics  . Alcohol use: Not Currently  . Drug use: Never     Allergies   Patient has no known allergies.   Review of Systems Review of Systems + sore throat + hoarse + cough No pleuritic pain No wheezing + nasal congestion + post-nasal drainage No sinus pain/pressure No itchy/red eyes No earache, but right ear feels clogged. No hemoptysis No SOB No fever/chills No nausea No vomiting No abdominal pain No diarrhea No urinary symptoms No skin rash + fatigue No myalgias No headache    Physical Exam Triage Vital Signs ED Triage Vitals  Enc Vitals Group     BP 01/13/21 1733 (!) 189/102     Pulse Rate 01/13/21 1733 65     Resp 01/13/21 1733 18     Temp 01/13/21 1733 98.3 F (36.8 C)     Temp Source 01/13/21 1733 Oral     SpO2 01/13/21 1733 95 %     Weight 01/13/21 1734 205 lb (93 kg)     Height 01/13/21 1734 5\' 11"  (1.803 m)     Head Circumference --      Peak Flow --      Pain  Score 01/13/21 1734 6     Pain Loc --      Pain Edu? --      Excl. in GC? --    No data found.  Updated Vital Signs BP (!) 189/102 (BP Location: Right Arm)   Pulse 65   Temp 98.3 F (36.8 C) (Oral)   Resp 18   Ht 5\' 11"  (1.803 m)   Wt 93 kg   SpO2 95%   BMI 28.59 kg/m   Visual Acuity Right Eye Distance:   Left Eye Distance:   Bilateral Distance:    Right Eye Near:   Left Eye Near:    Bilateral Near:     Physical Exam Nursing notes and Vital Signs reviewed. Appearance:  Patient appears stated age, and in no acute distress Eyes:  Pupils are equal, round, and reactive to light and accomodation.  Extraocular movement is intact.  Conjunctivae are not inflamed  Ears:   Left canal and tympanic membrane normal.  Right canal occluded with cerumen.  Post lavage, the right canal and tympanic membrane normal. Nose:  Mildly congested turbinates.  No sinus tenderness.   Pharynx:  Mildly  erythematous. Neck:  Supple.  Mildly enlarged lateral nodes are present, tender to palpation on the left.   Lungs:  Clear to auscultation.  Breath sounds are equal.  Moving air well. Heart:  Regular rate and rhythm without murmurs, rubs, or gallops.  Abdomen:  Nontender without masses or hepatosplenomegaly.  Bowel sounds are present.  No CVA or flank tenderness.  Extremities:  No edema.  Skin:  No rash present.   UC Treatments / Results  Labs (all labs ordered are listed, but only abnormal results are displayed) Labs Reviewed  SARS-COV-2 RNA,(COVID-19) QUALITATIVE NAAT  POCT RAPID STREP A (OFFICE) negatuve    EKG   Radiology No results found.  Procedures Procedures (including critical care time)  Medications Ordered in UC Medications - No data to display  Initial Impression / Assessment and Plan / UC Course  I have reviewed the triage vital signs and the nursing notes.  Pertinent labs & imaging results that were available during my care of the patient were reviewed by me and considered in my medical decision making (see chart for details).    Benign exam.  There is no evidence of bacterial infection today.  Treat symptomatically for now  Followup with Family Doctor if not improved in 7 to 10 days.   Final Clinical Impressions(s) / UC Diagnoses   Final diagnoses:  Acute pharyngitis, unspecified etiology  Viral URI with cough  Impacted cerumen of right ear     Discharge Instructions     Take plain guaifenesin (1200mg  extended release tabs such as Mucinex) twice daily, with plenty of water, for cough and congestion.  Get adequate rest.   May use Afrin nasal spray (or generic oxymetazoline) each morning for about 5 days and then discontinue.  Also recommend using saline nasal spray several times daily and saline nasal irrigation (AYR is a common brand).  Use Flonase nasal spray each morning after using Afrin nasal spray and saline nasal irrigation. Try warm salt water  gargles for sore throat.  Stop all antihistamines for now, and other non-prescription cough/cold preparations. May take Delsym Cough Suppressant ("12 Hour Cough Relief") at bedtime for nighttime cough.  Begin Doxycycline if not improving about one week or if persistent fever develops (Given a prescription to hold, with an expiration date)      If your COVID-19  test is positive, isolate yourself for five days from the date of testing. At the end of five days you may end isolation if your symptoms have cleared or improved, and you have not had a fever for 24 hours. At this time you should wear a mask for five more days when you are around others.            ED Prescriptions    Medication Sig Dispense Auth. Provider   doxycycline (VIBRAMYCIN) 100 MG capsule Take one cap PO Q12hr with food. 14 capsule Lattie Haw, MD        Lattie Haw, MD 01/14/21 [phone]

## 2021-01-14 LAB — SARS-COV-2 RNA,(COVID-19) QUALITATIVE NAAT: SARS CoV2 RNA: NOT DETECTED

## 2021-05-09 ENCOUNTER — Other Ambulatory Visit: Payer: Self-pay

## 2021-05-09 ENCOUNTER — Emergency Department
Admission: EM | Admit: 2021-05-09 | Discharge: 2021-05-09 | Disposition: A | Discharge status: Home / Self Care | Payer: Medicare Other | Source: Home / Self Care

## 2021-05-09 DIAGNOSIS — Z883 Allergy status to other anti-infective agents status: Secondary | ICD-10-CM

## 2021-05-09 DIAGNOSIS — L233 Allergic contact dermatitis due to drugs in contact with skin: Secondary | ICD-10-CM

## 2021-05-09 MED ORDER — PREDNISONE 20 MG PO TABS: 20.0000 mg | ORAL_TABLET | Freq: Two times a day (BID) | ORAL | 0 refills | Status: DC

## 2021-05-09 MED ORDER — TRIAMCINOLONE ACETONIDE 0.1 % EX OINT: 1.0000 "application " | TOPICAL_OINTMENT | Freq: Two times a day (BID) | CUTANEOUS | 1 refills | Status: DC

## 2021-05-09 NOTE — ED Triage Notes (Signed)
Pt c/o rash on RT arm and wrist x 6 weeks. Says he had a box spring cause an abrasion to RT wrist and its continued to be an issue, some drainage. Not painful, but says its itchy. Currently covered in Neosporin.

## 2021-05-09 NOTE — Discharge Instructions (Addendum)
Stop using Neosporin.  You are allergic to the neomycin component You may use bacitracin ointment in the future On your to current rashes wash 2 times a day and apply triamcinolone ointment.  Leave open to air. Take prednisone 2 times a day for 5 days Call or return if not improving in a week

## 2021-05-19 IMAGING — DX DG CHEST 2V
2 series · 2 of 2 positions shown · non-contrast
Comparison: Chest radiograph 12/31/2007

CLINICAL DATA: Cough, congestion. Additional history provided:
Patient reports cough productive of green sputum and shortness of
breath for 1 week.

EXAM:
CHEST - 2 VIEW

[chest pa]
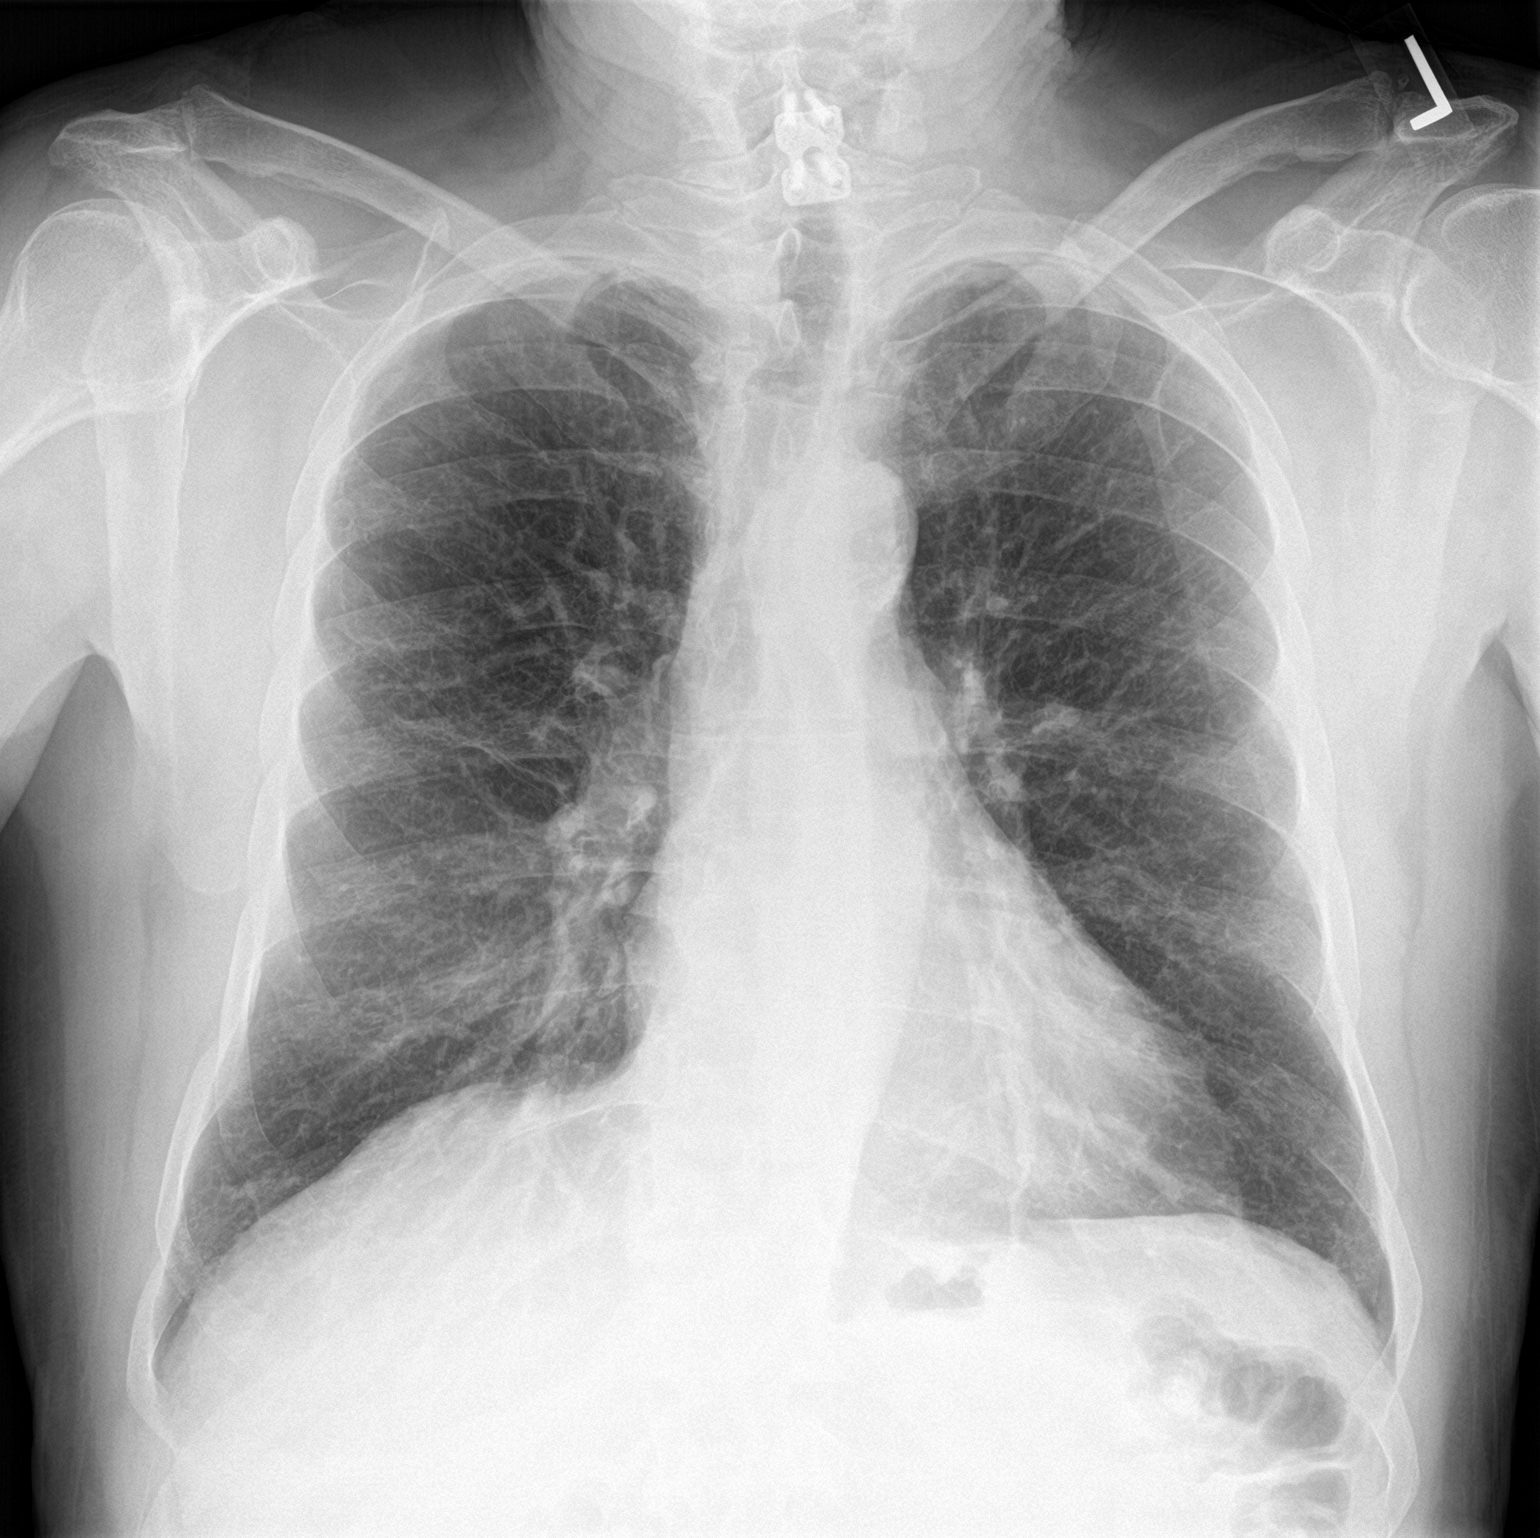

[chest lat]
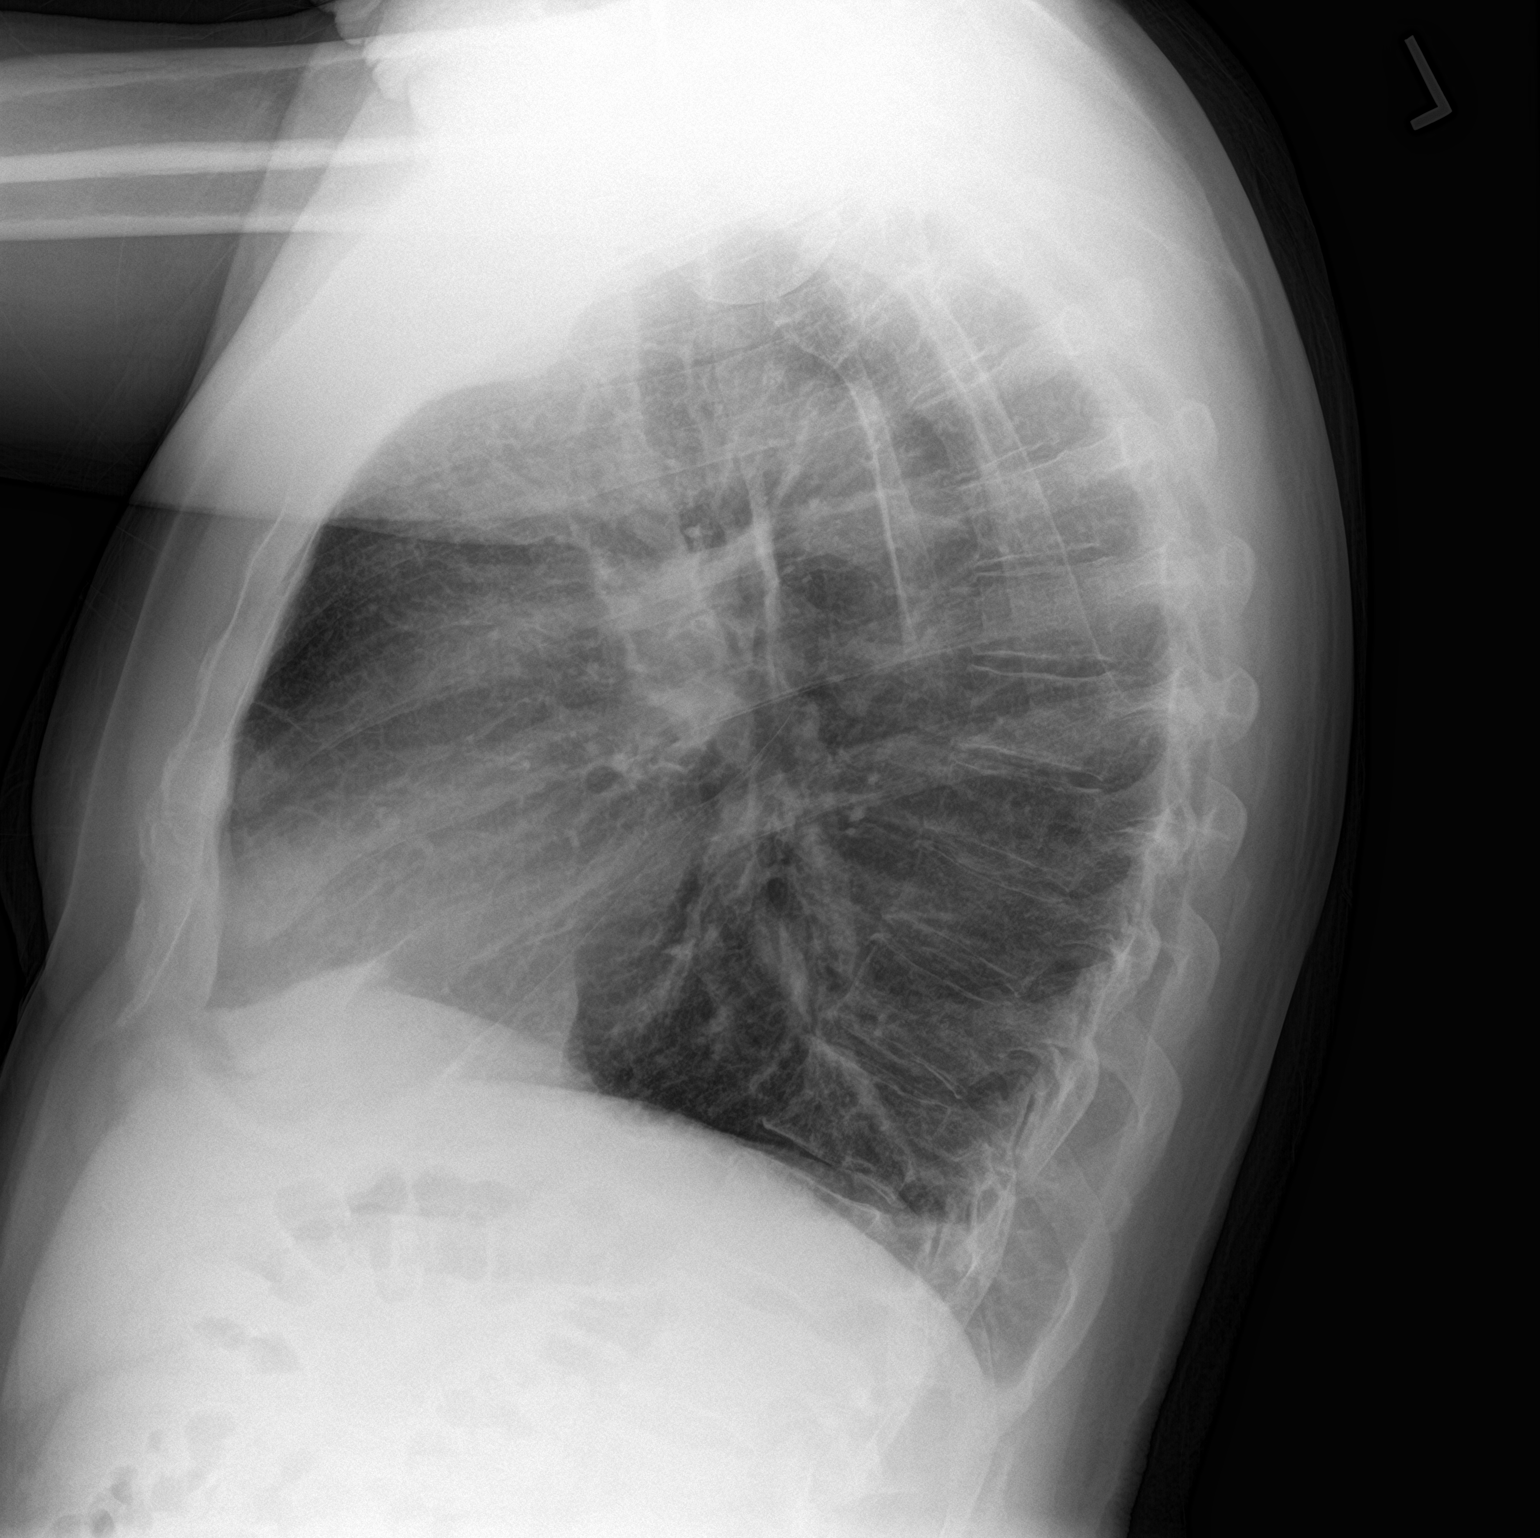

[2 of 2 positions shown; findings below may reference images not displayed]

FINDINGS: Heart size within normal limits. Aortic atherosclerosis. Minimal
linear atelectasis within the lung bases. No definite airspace
consolidation. No evidence of pleural effusion or pneumothorax. No
acute bony abnormality. Partially visualized cervical spinal fusion
hardware.
IMPRESSION: Minimal linear atelectasis within the lung bases. No definite
airspace consolidation.

Aortic atherosclerosis.

## 2022-02-06 ENCOUNTER — Encounter: Payer: Self-pay | Admitting: Emergency Medicine

## 2022-02-06 ENCOUNTER — Emergency Department (INDEPENDENT_AMBULATORY_CARE_PROVIDER_SITE_OTHER): Payer: BC Managed Care – PPO

## 2022-02-06 ENCOUNTER — Emergency Department (INDEPENDENT_AMBULATORY_CARE_PROVIDER_SITE_OTHER)
Admission: EM | Admit: 2022-02-06 | Discharge: 2022-02-06 | Disposition: A | Discharge status: Home / Self Care | Payer: BC Managed Care – PPO | Source: Home / Self Care

## 2022-02-06 DIAGNOSIS — R059 Cough, unspecified: Secondary | ICD-10-CM | POA: Diagnosis not present

## 2022-02-06 DIAGNOSIS — J0100 Acute maxillary sinusitis, unspecified: Secondary | ICD-10-CM | POA: Diagnosis not present

## 2022-02-06 LAB — POC SARS CORONAVIRUS 2 AG -  ED: SARS Coronavirus 2 Ag: NEGATIVE

## 2022-02-06 MED ORDER — BENZONATATE 200 MG PO CAPS: 200.0000 mg | ORAL_CAPSULE | Freq: Three times a day (TID) | ORAL | 0 refills | Status: AC | PRN

## 2022-02-06 MED ORDER — AMOXICILLIN-POT CLAVULANATE 875-125 MG PO TABS: 1.0000 | ORAL_TABLET | Freq: Two times a day (BID) | ORAL | 0 refills | Status: AC

## 2022-02-06 MED ORDER — PREDNISONE 20 MG PO TABS: ORAL_TABLET | ORAL | 0 refills | Status: DC

## 2022-02-06 NOTE — ED Triage Notes (Signed)
Pt states yesterday he developed a cough and headache. Denies fever.  ?

## 2022-02-06 NOTE — ED Provider Notes (Signed)
?[facility] ? ? ? ?CSN: [id number] ?Arrival date & time: 02/06/22  0801 ? ? ?  ? ?History   ?Chief Complaint ?Chief Complaint  ?Patient presents with  ? Cough  ? ? ?HPI ?Devon Short is a 66 y.o. male.  ? ?HPI Pleasant 66 year old male presents with cough and headache for 1 day.  Reports his wife is currently on dialysis and he does not want to be sick around her.  PMH significant for MI and colonic polyps.  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 ? ?Past Medical History:  ?Diagnosis Date  ? Heart attack (HCC)   ? ? ?Patient Active Problem List  ? Diagnosis Date Noted  ? OTHER AND UNSPECIFIED HYPERLIPIDEMIA 04/12/2008  ? ERECTILE DYSFUNCTION 12/30/2007  ? RIB PAIN, LEFT SIDED 12/30/2007  ? COLONIC POLYPS, BENIGN, HX OF 12/30/2007  ? ? ?History reviewed. No pertinent surgical history. ? ? ? ? ?Home Medications   ? ?Prior to Admission medications   ?Medication Sig Start Date End Date Taking? Authorizing Provider  ?amoxicillin-clavulanate (AUGMENTIN) 875-125 MG tablet Take 1 tablet by mouth every 12 (twelve) hours for 10 days. 02/06/22 02/16/22 Yes Trevor Iha, FNP  ?benzonatate (TESSALON) 200 MG capsule Take 1 capsule (200 mg total) by mouth 3 (three) times daily as needed for up to 7 days for cough. 02/06/22 02/13/22 Yes Trevor Iha, FNP  ?predniSONE (DELTASONE) 20 MG tablet Take 3 tabs PO daily x 5 days. 02/06/22  Yes Trevor Iha, FNP  ?aspirin 81 MG chewable tablet Chew by mouth daily.    [provider]  ?atorvastatin (LIPITOR) 40 MG tablet Take 40 mg by mouth daily.    [provider]  ?carvedilol (COREG) 12.5 MG tablet Take 12.5 mg by mouth 2 (two) times daily with a meal.    [provider]  ?isosorbide mononitrate (IMDUR) 60 MG 24 hr tablet Take 60 mg by mouth daily.    [provider]  ?lisinopril (ZESTRIL) 40 MG tablet Take 40 mg by mouth daily.    [provider]  ?Omega-3 1000 MG CAPS Take by mouth.    [provider]  ?triamcinolone ointment (KENALOG) 0.1 % Apply 1 application topically 2 (two) times daily. 05/09/21   Eustace Moore, MD  ? ? ?Family History ?Family History  ?Problem Relation Age of Onset  ? Cancer Father   ? ? ?Social History ?Social History  ? ?Tobacco Use  ? Smoking status: Former  ?  Years: 45.00  ?  Types: Cigarettes  ?  Quit date: 01/04/2018  ?  Years since quitting: 4.0  ? Smokeless tobacco: Never  ?Vaping Use  ? Vaping Use: Never used  ?Substance Use Topics  ? Alcohol use: Not Currently  ? Drug use: Never  ? ? ? ?Allergies   ?Neomycin ? ? ?Review of Systems ?Review of Systems  ?HENT:  Positive for congestion, sinus pressure and sinus pain.   ?Respiratory:  Positive for cough and shortness of breath.   ?Neurological:  Positive for headaches.  ?All other systems reviewed and are negative. ? ? ?Physical Exam ?Triage Vital Signs ?ED Triage Vitals  ?Enc Vitals Group  ?   BP 02/06/22 0813 114/67  ?   Pulse Rate 02/06/22 0813 62  ?   Resp 02/06/22 0813 18  ?   Temp 02/06/22 0813 97.6 ?F (36.4 ?C)  ?   Temp Source 02/06/22 0813 Oral  ?   SpO2 02/06/22 0813 95 %  ?  Weight --   ?   Height --   ?   Head Circumference --   ?   Peak Flow --   ?   Pain Score 02/06/22 0815 0  ?   Pain Loc --   ?   Pain Edu? --   ?   Excl. in GC? --   ? ?No data found. ? ?Updated Vital Signs ?BP 114/67 (BP Location: Right Arm)   Pulse 62   Temp 97.6 ?F (36.4 ?C) (Oral)   Resp 18   SpO2 95%  ? ?  ? ?Physical Exam ?Vitals and nursing note reviewed.  ?Constitutional:   ?   General: He is not in acute distress. ?   Appearance: Normal appearance. He is obese. He is not ill-appearing.  ?HENT:  ?   Head: Normocephalic and atraumatic.  ?   Right Ear: Tympanic membrane, ear canal and external ear normal.  ?   Left Ear: Tympanic membrane, ear canal and external ear normal.  ?   Nose: Nose normal.  ?   Mouth/Throat:  ?   Mouth: Mucous membranes are moist.  ?   Pharynx: Oropharynx is clear.  ?Eyes:  ?   Extraocular Movements:  Extraocular movements intact.  ?   Conjunctiva/sclera: Conjunctivae normal.  ?   Pupils: Pupils are equal, round, and reactive to light.  ?Cardiovascular:  ?   Rate and Rhythm: Normal rate and regular rhythm.  ?   Pulses: Normal pulses.  ?   Heart sounds: Normal heart sounds.  ?Pulmonary:  ?   Effort: Pulmonary effort is normal.  ?   Breath sounds: No wheezing, rhonchi or rales.  ?   Comments: Minutes breath sounds bibasilarly, infrequent nonproductive cough noted on exam ?Musculoskeletal:  ?   Cervical back: Normal range of motion and neck supple. No tenderness.  ?Lymphadenopathy:  ?   Cervical: No cervical adenopathy.  ?Skin: ?   General: Skin is warm and dry.  ?Neurological:  ?   General: No focal deficit present.  ?   Mental Status: He is alert and oriented to person, place, and time. Mental status is at baseline.  ? ? ? ?UC Treatments / Results  ?Labs ?(all labs ordered are listed, but only abnormal results are displayed) ?Labs Reviewed  ?POC SARS CORONAVIRUS 2 AG -  ED  ? ? ?EKG ? ? ?Radiology ?DG Chest 2 View ? ?Result Date: 02/06/2022 ?CLINICAL DATA:  Cough EXAM: CHEST - 2 VIEW COMPARISON:  01/05/2020 FINDINGS: The heart size and mediastinal contours are within normal limits. Aortic atherosclerosis. No focal airspace consolidation, pleural effusion, or pneumothorax. The visualized skeletal structures are unremarkable. IMPRESSION: No active cardiopulmonary disease. Electronically Signed   By: Duanne GuessNicholas  Plundo D.O.   On: 02/06/2022 09:00   ? ?Procedures ?Procedures (including critical care time) ? ?Medications Ordered in UC ?Medications - No data to display ? ?Initial Impression / Assessment and Plan / UC Course  ?I have reviewed the triage vital signs and the nursing notes. ? ?Pertinent labs & imaging results that were available during my care of the patient were reviewed by me and considered in my medical decision making (see chart for details). ? ?  ? ?DM: 1.  Subacute maxillary sinusitis-Rx'd Augmentin; 2.   Cough-COVID-19 negative, CXR revealed above, Rx'd prednisone, Tessalon Perles. Instructed patient to take medication as directed with food to completion.  Advised patient to take prednisone with first dose of Augmentin for the next 5 of 10 days.  Advised  may use Tessalon Perles daily or as needed for cough.  Encouraged patient to increase daily water intake while taking these medications.  Advised patient if symptoms worsen and/or unresolved please follow-up with PCP or here for further evaluation.  Work note provided per patient request. Patient discharged home, hemodynamically stable. ?Final Clinical Impressions(s) / UC Diagnoses  ? ?Final diagnoses:  ?Cough, unspecified type  ?Subacute maxillary sinusitis  ? ? ? ?Discharge Instructions   ? ?  ?Instructed patient to take medication as directed with food to completion.  Advised patient to take prednisone with first dose of Augmentin for the next 5 of 10 days.  Advised may use Tessalon Perles daily or as needed for cough.  Encouraged patient to increase daily water intake while taking these medications.  Advised patient if symptoms worsen and/or unresolved please follow-up with PCP or here for further evaluation. ? ? ? ? ?ED Prescriptions   ? ? Medication Sig Dispense Auth. Provider  ? amoxicillin-clavulanate (AUGMENTIN) 875-125 MG tablet Take 1 tablet by mouth every 12 (twelve) hours for 10 days. 20 tablet Trevor Iha, FNP  ? predniSONE (DELTASONE) 20 MG tablet Take 3 tabs PO daily x 5 days. 15 tablet Trevor Iha, FNP  ? benzonatate (TESSALON) 200 MG capsule Take 1 capsule (200 mg total) by mouth 3 (three) times daily as needed for up to 7 days for cough. 40 capsule Trevor Iha, FNP  ? ?  ? ?PDMP not reviewed this encounter. ?  ?Trevor Iha, FNP ?02/06/22 0100 ? ?

## 2022-02-06 NOTE — Discharge Instructions (Addendum)
Instructed patient to take medication as directed with food to completion.  Advised patient to take prednisone with first dose of Augmentin for the next 5 of 10 days.  Advised may use Tessalon Perles daily or as needed for cough.  Encouraged patient to increase daily water intake while taking these medications.  Advised patient if symptoms worsen and/or unresolved please follow-up with PCP or here for further evaluation. ?

## 2022-10-07 ENCOUNTER — Other Ambulatory Visit: Payer: Self-pay

## 2022-10-07 ENCOUNTER — Ambulatory Visit (INDEPENDENT_AMBULATORY_CARE_PROVIDER_SITE_OTHER): Payer: BC Managed Care – PPO

## 2022-10-07 ENCOUNTER — Encounter: Payer: Self-pay | Admitting: Emergency Medicine

## 2022-10-07 ENCOUNTER — Ambulatory Visit
Admission: EM | Admit: 2022-10-07 | Discharge: 2022-10-07 | Disposition: A | Discharge status: Home / Self Care | Payer: Medicare Other | Attending: Family Medicine | Admitting: Family Medicine

## 2022-10-07 DIAGNOSIS — M62830 Muscle spasm of back: Secondary | ICD-10-CM

## 2022-10-07 DIAGNOSIS — M5432 Sciatica, left side: Secondary | ICD-10-CM | POA: Diagnosis not present

## 2022-10-07 DIAGNOSIS — S39012A Strain of muscle, fascia and tendon of lower back, initial encounter: Secondary | ICD-10-CM | POA: Diagnosis not present

## 2022-10-07 DIAGNOSIS — M545 Low back pain, unspecified: Secondary | ICD-10-CM

## 2022-10-07 MED ORDER — PREDNISONE 10 MG (21) PO TBPK: ORAL_TABLET | Freq: Every day | ORAL | 0 refills | Status: DC

## 2022-10-07 MED ORDER — HYDROCODONE-ACETAMINOPHEN 5-325 MG PO TABS: 1.0000 | ORAL_TABLET | Freq: Three times a day (TID) | ORAL | 0 refills | Status: AC

## 2022-10-07 MED ORDER — METHOCARBAMOL 500 MG PO TABS: 500.0000 mg | ORAL_TABLET | Freq: Three times a day (TID) | ORAL | 0 refills | Status: DC | PRN

## 2022-10-07 MED ORDER — CELECOXIB 100 MG PO CAPS: 100.0000 mg | ORAL_CAPSULE | Freq: Two times a day (BID) | ORAL | 0 refills | Status: AC

## 2022-10-07 NOTE — Discharge Instructions (Addendum)
Instructed patient to take medication as directed with food to completion.  Advised patient to take Sterapred Unipak dose with first dose of Celebrex for the next 10 days.  Advised may use Norco for breakthrough lower back pain.  Advised may use Robaxin for accompanying muscle spasms of lower back.  Encouraged patient to increase daily water intake to 64 ounces per day and to avoid any offending activities of lower back for the next 7 to 10 days.  Advised if symptoms worsen and/or unresolved please follow-up with PCP or [place] orthopedic provider for further evaluation.  Contact information is below.

## 2022-10-07 NOTE — ED Triage Notes (Signed)
Patient presents to Urgent Care with complaints of low back pain since 1 week ago. Patient reports having left lower back pain. Movement of any kind makes worst. Took Back and Body pain reliever and Tylenol for pain.

## 2022-10-07 NOTE — ED Provider Notes (Signed)
[facility]    CSN: [id number] Arrival date & time: 10/07/22  0808      History   Chief Complaint Chief Complaint  Patient presents with   Back Pain    HPI COBIN CADAVID is a 67 y.o. male.   HPI Very pleasant 67 year old male presents with low back pain for 1 week reports movement makes lower back pain worse.  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  PMH significant for CAD, MI and colonic polyps.  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 No pertinent surgical history.     Home Medications    Prior to Admission medications   Medication Sig Start Date End Date Taking? Authorizing Provider  aspirin 81 MG chewable tablet Chew by mouth daily.   Yes [provider]  atorvastatin (LIPITOR) 40 MG tablet Take 40 mg by mouth daily.   Yes [provider]  carvedilol (COREG) 12.5 MG tablet Take 12.5 mg by mouth 2 (two) times daily with a meal.   Yes [provider]  celecoxib (CELEBREX) 100 MG capsule Take 1 capsule (100 mg total) by mouth 2 (two) times daily for 15 days. 10/07/22 10/22/22 Yes Trevor Iha, FNP  HYDROcodone-acetaminophen (NORCO/VICODIN) 5-325 MG tablet Take 1 tablet by mouth every 8 (eight) hours for 7 days. 10/07/22 10/14/22 Yes Trevor Iha, FNP  isosorbide mononitrate (IMDUR) 60 MG 24 hr tablet Take 60 mg by mouth daily.   Yes [provider]  lisinopril (ZESTRIL) 40 MG tablet Take 40 mg by mouth daily.   Yes [provider]  methocarbamol (ROBAXIN) 500 MG tablet Take 1 tablet (500 mg total) by mouth 3 (three) times daily as needed. 10/07/22  Yes Trevor Iha, FNP  Omega-3 1000 MG CAPS Take by mouth.   Yes [provider]   predniSONE (STERAPRED UNI-PAK 21 TAB) 10 MG (21) TBPK tablet Take by mouth daily. Take 6 tabs by mouth daily  for 2 days, then 5 tabs for 2 days, then 4 tabs for 2 days, then 3 tabs for 2 days, 2 tabs for 2 days, then 1 tab by mouth daily for 2 days 10/07/22  Yes Trevor Iha, FNP  triamcinolone ointment (KENALOG) 0.1 % Apply 1 application topically 2 (two) times daily. 05/09/21  Yes Eustace Moore, MD    Family History Family History  Problem Relation Age of Onset   Cancer Father     Social History Social History   Tobacco Use   Smoking status: Former    Years: 45.00    Types: Cigarettes    Quit date: 01/04/2018    Years since quitting: 4.7   Smokeless tobacco: Never  Vaping Use   Vaping Use: Never used  Substance Use Topics   Alcohol use: Not Currently   Drug use: Never     Allergies   Atorvastatin and Neomycin   Review of Systems Review of Systems  Musculoskeletal:  Positive for back pain.  All other systems reviewed and are negative.    Physical Exam Triage Vital Signs ED Triage Vitals  Enc Vitals Group     BP 10/07/22 0825 (!) 157/90     Pulse Rate 10/07/22 0825 (!) 58  Resp 10/07/22 0825 16     Temp 10/07/22 0825 98 F (36.7 C)     Temp Source 10/07/22 0825 Oral     SpO2 10/07/22 0825 96 %     Weight --      Height --      Head Circumference --      Peak Flow --      Pain Score 10/07/22 0823 8     Pain Loc --      Pain Edu? --      Excl. in [place]? --    No data found.  Updated Vital Signs BP (!) 157/90 (BP Location: Left Arm)   Pulse (!) 58   Temp 98 F (36.7 C) (Oral)   Resp 16   SpO2 96%   Visual Acuity Right Eye Distance:   Left Eye Distance:   Bilateral Distance:    Right Eye Near:   Left Eye Near:    Bilateral Near:     Physical Exam Vitals and nursing note reviewed.  Constitutional:      General: He is not in acute distress.    Appearance: Normal appearance. He is normal weight. He is ill-appearing.  HENT:     Head:  Normocephalic and atraumatic.     Mouth/Throat:     Mouth: Mucous membranes are moist.     Pharynx: Oropharynx is clear.  Eyes:     Extraocular Movements: Extraocular movements intact.     Conjunctiva/sclera: Conjunctivae normal.     Pupils: Pupils are equal, round, and reactive to light.  Cardiovascular:     Rate and Rhythm: Normal rate and regular rhythm.     Pulses: Normal pulses.     Heart sounds: Normal heart sounds.  Pulmonary:     Effort: Pulmonary effort is normal.     Breath sounds: Normal breath sounds. No wheezing, rhonchi or rales.  Musculoskeletal:        General: Normal range of motion.     Cervical back: Normal range of motion and neck supple.     Comments: Lumbar sacral spine (left-sided inferior aspect): Moderately TTP over spinous processes paraspinous muscles radiating to left hip/left buttocks/left upper leg; patient uncomfortable in seated and standing position  Skin:    General: Skin is warm and dry.  Neurological:     General: No focal deficit present.     Mental Status: He is alert and oriented to person, place, and time.      UC Treatments / Results  Labs (all labs ordered are listed, but only abnormal results are displayed) Labs Reviewed - No data to display  EKG   Radiology DG Lumbar Spine Complete  Result Date: 10/07/2022 CLINICAL DATA:  Worsening low back pain for a week. EXAM: LUMBAR SPINE - COMPLETE 4+ VIEW COMPARISON:  None Available. FINDINGS: Degenerative hypertrophic changes of the posterior elements at the L4-5 and L5-S1 levels, at least moderate in degree. Slight retrolisthesis of L5 is likely related to these underlying degenerative changes. Disc spaces of the lumbar spine are relatively well preserved throughout. No acute-appearing osseous abnormality. Aortic atherosclerosis. Visualized paravertebral soft tissues are otherwise unremarkable. IMPRESSION: 1. No acute findings. 2. Degenerative hypertrophic changes of the posterior elements at  the L4-5 and L5-S1 levels, at least moderate in degree. If any associated radiculopathic symptoms, would consider nonemergent lumbar spine MRI to exclude associated nerve root impingement. 3. Aortic atherosclerosis. Electronically Signed   By: Franki Cabot M.D.   On: 10/07/2022 08:49  Procedures Procedures (including critical care time)  Medications Ordered in UC Medications - No data to display  Initial Impression / Assessment and Plan / UC Course  I have reviewed the triage vital signs and the nursing notes.  Pertinent labs & imaging results that were available during my care of the patient were reviewed by me and considered in my medical decision making (see chart for details).     MDM: 1.  Sciatica of left side-x-ray of lumbar sacral spine revealed above Rx'd Sterapred Unipak; 2.  Strain of lumbar region, initial encounter-Rx Celebrex, Norco; 3.  Muscle spasm of back-Rx'd Robaxin. Instructed patient to take medication as directed with food to completion.  Advised patient to take Sterapred Unipak dose with first dose of Celebrex for the next 10 days.  Advised may use Norco for breakthrough lower back pain.  Advised may use Robaxin for accompanying muscle spasms of lower back.  Encouraged patient to increase daily water intake to 64 ounces per day and to avoid any offending activities of lower back for the next 7 to 10 days.  Advised if symptoms worsen and/or unresolved please follow-up with PCP or [place] orthopedic provider for further evaluation.  Contact information is below.  Work note provided to patient for 1 week-per patient request and needed to rest of lower back.  Patient discharged home, hemodynamically stable. Final Clinical Impressions(s) / UC Diagnoses   Final diagnoses:  Strain of lumbar region, initial encounter  Sciatica of left side  Muscle spasm of back     Discharge Instructions      Instructed patient to take medication as directed with food to completion.   Advised patient to take Sterapred Unipak dose with first dose of Celebrex for the next 10 days.  Advised may use Norco for breakthrough lower back pain.  Advised may use Robaxin for accompanying muscle spasms of lower back.  Encouraged patient to increase daily water intake to 64 ounces per day and to avoid any offending activities of lower back for the next 7 to 10 days.  Advised if symptoms worsen and/or unresolved please follow-up with PCP or [place] orthopedic provider for further evaluation.  Contact information is below.     ED Prescriptions     Medication Sig Dispense Auth. Provider   predniSONE (STERAPRED UNI-PAK 21 TAB) 10 MG (21) TBPK tablet Take by mouth daily. Take 6 tabs by mouth daily  for 2 days, then 5 tabs for 2 days, then 4 tabs for 2 days, then 3 tabs for 2 days, 2 tabs for 2 days, then 1 tab by mouth daily for 2 days 42 tablet Eliezer Lofts, FNP   celecoxib (CELEBREX) 100 MG capsule Take 1 capsule (100 mg total) by mouth 2 (two) times daily for 15 days. 30 capsule Eliezer Lofts, FNP   methocarbamol (ROBAXIN) 500 MG tablet Take 1 tablet (500 mg total) by mouth 3 (three) times daily as needed. 30 tablet Eliezer Lofts, FNP   HYDROcodone-acetaminophen (NORCO/VICODIN) 5-325 MG tablet Take 1 tablet by mouth every 8 (eight) hours for 7 days. 21 tablet Eliezer Lofts, FNP      I have reviewed the PDMP during this encounter.   Eliezer Lofts, [place] 10/07/22 [phone]

## 2022-10-30 ENCOUNTER — Ambulatory Visit (INDEPENDENT_AMBULATORY_CARE_PROVIDER_SITE_OTHER): Payer: BC Managed Care – PPO

## 2022-10-30 ENCOUNTER — Ambulatory Visit
Admission: EM | Admit: 2022-10-30 | Discharge: 2022-10-30 | Disposition: A | Discharge status: Home / Self Care | Payer: Medicare Other | Attending: Urgent Care | Admitting: Urgent Care

## 2022-10-30 DIAGNOSIS — G4700 Insomnia, unspecified: Secondary | ICD-10-CM

## 2022-10-30 DIAGNOSIS — J0120 Acute ethmoidal sinusitis, unspecified: Secondary | ICD-10-CM

## 2022-10-30 DIAGNOSIS — J029 Acute pharyngitis, unspecified: Secondary | ICD-10-CM | POA: Diagnosis not present

## 2022-10-30 DIAGNOSIS — R059 Cough, unspecified: Secondary | ICD-10-CM | POA: Diagnosis not present

## 2022-10-30 DIAGNOSIS — R0982 Postnasal drip: Secondary | ICD-10-CM

## 2022-10-30 DIAGNOSIS — I10 Essential (primary) hypertension: Secondary | ICD-10-CM

## 2022-10-30 MED ORDER — TRAZODONE HCL 50 MG PO TABS: 50.0000 mg | ORAL_TABLET | Freq: Every evening | ORAL | 0 refills | Status: DC | PRN

## 2022-10-30 MED ORDER — PREDNISONE 20 MG PO TABS: 20.0000 mg | ORAL_TABLET | Freq: Every day | ORAL | 0 refills | Status: AC

## 2022-10-30 MED ORDER — AMOXICILLIN-POT CLAVULANATE 875-125 MG PO TABS: 1.0000 | ORAL_TABLET | Freq: Two times a day (BID) | ORAL | 0 refills | Status: AC

## 2022-10-30 NOTE — ED Provider Notes (Signed)
[facility]    CSN: [id number] Arrival date & time: 10/30/22  1608      History   Chief Complaint Chief Complaint  Patient presents with   Sore Throat    HPI Devon Short is a 67 y.o. male.   Pleasant 67 year old male presents today primarily due to concerns of a sore throat for the past 3 weeks.  He reports some nasal congestion and cough as well.  States his voice has been going in and out.  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  He denies a known fever.  States he feels slightly tight in his chest.  Denies any chronic pulmonary issues, smoked many years ago but none recently.  He has tried Chloraseptic spray and Vicks rub without any relief. Also mucinex nasal spray. Patient states he has noted some nasal bleeding. Additionally, patient reports sleep issues for the past 10 years.  He is requesting to try something other than over-the-counter medications at this point.  States he gets roughly 3 hours of sleep.  States many years ago he tried Xanax which worked well.  He states his mind is not racing, and he is not in pain.  He denies restless leg symptoms.  He has never been diagnosed with obstructive sleep apnea.  He does admit to getting up every 3 hours to urinate.  He does not have a PCP at present time.   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 No pertinent surgical history.     Home Medications    Prior to Admission medications   Medication Sig Start Date End Date Taking? Authorizing Provider  amoxicillin-clavulanate (AUGMENTIN) 875-125 MG tablet Take 1 tablet by mouth 2 (two) times daily with a meal for 10 days.  10/30/22 11/09/22 Yes Daniyah Fohl L, PA  predniSONE (DELTASONE) 20 MG tablet Take 1 tablet (20 mg total) by mouth daily with breakfast for 5 days. 10/30/22 11/04/22 Yes Alanea Woolridge L, PA  traZODone (DESYREL) 50 MG tablet Take 1 tablet (50 mg total) by mouth at bedtime as needed for sleep. 10/30/22  Yes Shea Swalley L, PA  aspirin 81 MG chewable tablet Chew by mouth daily.    [provider]  atorvastatin (LIPITOR) 40 MG tablet Take 40 mg by mouth daily.    [provider]  carvedilol (COREG) 12.5 MG tablet Take 12.5 mg by mouth 2 (two) times daily with a meal.    [provider]  isosorbide mononitrate (IMDUR) 60 MG 24 hr tablet Take 60 mg by mouth daily.    [provider]  lisinopril (ZESTRIL) 40 MG tablet Take 40 mg by mouth daily.    [provider]  methocarbamol (ROBAXIN) 500 MG tablet Take 1 tablet (500 mg total) by mouth 3 (three) times daily as needed. 10/07/22   Eliezer Lofts, FNP  Omega-3 1000 MG CAPS Take by mouth.    [provider]  triamcinolone ointment (KENALOG) 0.1 % Apply 1 application topically 2 (two) times daily. 05/09/21   Raylene Everts, MD    Family History Family History  Problem Relation Age of Onset  Cancer Father     Social History Social History   Tobacco Use   Smoking status: Former    Years: 45.00    Types: Cigarettes    Quit date: 01/04/2018    Years since quitting: 4.8   Smokeless tobacco: Never  Vaping Use   Vaping Use: Never used  Substance Use Topics   Alcohol use: Not Currently   Drug use: Never     Allergies   Atorvastatin and Neomycin   Review of Systems Review of Systems As per HPI  Physical Exam Triage Vital Signs ED Triage Vitals  Enc Vitals Group     BP 10/30/22 1620 (!) 172/90     Pulse Rate 10/30/22 1620 (!) 57     Resp 10/30/22 1620 17     Temp 10/30/22 1620 98.4 F (36.9 C)     Temp Source 10/30/22 1620 Oral     SpO2 10/30/22 1620 94 %     Weight --       Height --      Head Circumference --      Peak Flow --      Pain Score 10/30/22 1621 3     Pain Loc --      Pain Edu? --      Excl. in [place]? --    No data found.  Updated Vital Signs BP (!) 170/85 (BP Location: Left Arm)   Pulse (!) 53   Temp 98.4 F (36.9 C) (Oral)   Resp 16   SpO2 96%   Visual Acuity Right Eye Distance:   Left Eye Distance:   Bilateral Distance:    Right Eye Near:   Left Eye Near:    Bilateral Near:     Physical Exam Vitals and nursing note reviewed.  Constitutional:      General: He is not in acute distress.    Appearance: Normal appearance. He is well-developed. He is not ill-appearing, toxic-appearing or diaphoretic.     Comments: Hoarse, raspy voice  HENT:     Head: Normocephalic and atraumatic.     Right Ear: Tympanic membrane, ear canal and external ear normal. There is no impacted cerumen.     Left Ear: Tympanic membrane, ear canal and external ear normal. There is no impacted cerumen.     Nose: Congestion and rhinorrhea present.     Right Nostril: No epistaxis.     Left Nostril: No epistaxis.     Mouth/Throat:     Mouth: Mucous membranes are moist.     Pharynx: Oropharynx is clear. Posterior oropharyngeal erythema present.     Comments: Bloody post nasal drainage noted Eyes:     General:        Right eye: No discharge.        Left eye: No discharge.     Extraocular Movements: Extraocular movements intact.     Conjunctiva/sclera: Conjunctivae normal.     Pupils: Pupils are equal, round, and reactive to light.  Cardiovascular:     Rate and Rhythm: Normal rate and regular rhythm.     Heart sounds: No murmur heard. Pulmonary:     Effort: Pulmonary effort is normal. No respiratory distress.     Breath sounds: Normal breath sounds. No stridor. No rhonchi or rales.     Comments: Upper airway wheezing without stridor Chest:     Chest wall: No tenderness.  Abdominal:     Palpations: Abdomen is soft.     Tenderness: There is no abdominal  tenderness.  Musculoskeletal:        General: No swelling.     Cervical back: Neck supple.  Skin:    General: Skin is warm and dry.     Capillary Refill: Capillary refill takes less than 2 seconds.     Findings: No erythema or rash.  Neurological:     General: No focal deficit present.     Mental Status: He is alert and oriented to person, place, and time.  Psychiatric:        Mood and Affect: Mood normal.      UC Treatments / Results  Labs (all labs ordered are listed, but only abnormal results are displayed) Labs Reviewed - No data to display  EKG   Radiology DG Chest 2 View  Result Date: 10/30/2022 CLINICAL DATA:  Cough and sore throat for three weeks.  Hypoxia. EXAM: CHEST - 2 VIEW COMPARISON:  Chest radiographs five sixteen twenty twenty three FINDINGS: Cardiac silhouette and mediastinal contours are within normal limits. The lungs are clear. No pleural effusion or pneumothorax. Mild-to-moderate multilevel degenerative disc changes of the thoracic spine. ACDF hardware overlies the lower cervical spine. IMPRESSION: No active cardiopulmonary disease. Electronically Signed   By: Yvonne Kendall M.D.   On: 10/30/2022 17:26    Procedures Procedures (including critical care time)  Medications Ordered in UC Medications - No data to display  Initial Impression / Assessment and Plan / UC Course  I have reviewed the triage vital signs and the nursing notes.  Pertinent labs & imaging results that were available during my care of the patient were reviewed by me and considered in my medical decision making (see chart for details).     Acute pharyngitis -patient had a bloody postnasal drainage noted on exam.  He has been using daily Mucinex and nasal spray which is the same as Afrin.  I suspect some of this drainage is secondary to medication overuse.  Postnasal drainage also likely the cause of his sore throat.  CXR obtained due to a low O2 noted initially to rule out atypical  pneumonia, which was negative. Postnasal drainage -noted on exam.  Patient with a very raspy voice, clinically suspect patient is suffering from an ethmoid sinusitis.  Try plain saline to flush and cleanse the sinus passage, stop the over-the-counter medication as this is likely leading to the bleeding. Ethmoidal sinusitis -we will do a combination between prednisone and Augmentin to help with the swelling and inflammation. Hypertension -patient states he takes his medication in the evening and is due for his next dose.  Patient monitors this at home with a usual systolic of 827.  Patient is also under the care of cardiology. Insomnia -multifactorial.  I will give patient a 1 month supply of trazodone.  I did recommend he follow-up with his cardiologist or establish care with a PCP for complete assessment.  Given his urinary frequency, he may need an assessment for BPH.   Final Clinical Impressions(s) / UC Diagnoses   Final diagnoses:  Acute pharyngitis, unspecified etiology  Post-nasal drainage  Acute non-recurrent ethmoidal sinusitis  Essential hypertension     Discharge Instructions      Your chest x-ray was negative. I am concerned that you have a sphenoid or ethmoid sinusitis causing postnasal drainage. Please stop the Mucinex nasal spray. Please start taking the Augmentin twice daily with food. Please take prednisone once daily in the morning  Regarding your sleep, I have called in trazodone for you to try.  Take 1 tablet 30 minutes before initiating sleep.  Please establish care with a PCP to further evaluate its effectiveness.  Furthermore, please monitor your blood pressure at home. Goal 120/80. Avoid any OTC medications with sudafed of phenylephrine as this can elevated blood pressure.       ED Prescriptions     Medication Sig Dispense Auth. Provider   amoxicillin-clavulanate (AUGMENTIN) 875-125 MG tablet Take 1 tablet by mouth 2 (two) times daily with a meal for 10  days. 20 tablet Erabella Kuipers L, PA   predniSONE (DELTASONE) 20 MG tablet Take 1 tablet (20 mg total) by mouth daily with breakfast for 5 days. 5 tablet Haydin Calandra L, PA   traZODone (DESYREL) 50 MG tablet Take 1 tablet (50 mg total) by mouth at bedtime as needed for sleep. 30 tablet Rahmon Heigl L, [place]      PDMP not reviewed this encounter.   Chaney Malling, [place] 10/30/22 [phone]

## 2022-10-30 NOTE — Discharge Instructions (Addendum)
Your chest x-ray was negative. I am concerned that you have a sphenoid or ethmoid sinusitis causing postnasal drainage. Please stop the Mucinex nasal spray. Please start taking the Augmentin twice daily with food. Please take prednisone once daily in the morning  Regarding your sleep, I have called in trazodone for you to try.  Take 1 tablet 30 minutes before initiating sleep.  Please establish care with a PCP to further evaluate its effectiveness.  Furthermore, please monitor your blood pressure at home. Goal 120/80. Avoid any OTC medications with sudafed of phenylephrine as this can elevated blood pressure.

## 2022-10-30 NOTE — ED Triage Notes (Signed)
Pt c/o sore throat and dry cough x 3 weeks. Hoarse voice. Denies fever. Using chloraseptic spray and vicks cough drops prn.

## 2023-06-02 ENCOUNTER — Other Ambulatory Visit: Payer: Self-pay

## 2023-06-02 ENCOUNTER — Encounter: Payer: Self-pay | Admitting: *Deleted

## 2023-06-02 ENCOUNTER — Ambulatory Visit
Admission: EM | Admit: 2023-06-02 | Discharge: 2023-06-02 | Disposition: A | Discharge status: Home / Self Care | Payer: Medicare Other | Attending: Family Medicine | Admitting: Family Medicine

## 2023-06-02 DIAGNOSIS — H9192 Unspecified hearing loss, left ear: Secondary | ICD-10-CM

## 2023-06-02 DIAGNOSIS — H6692 Otitis media, unspecified, left ear: Secondary | ICD-10-CM

## 2023-06-02 MED ORDER — AMOXICILLIN-POT CLAVULANATE 875-125 MG PO TABS: 1.0000 | ORAL_TABLET | Freq: Two times a day (BID) | ORAL | 0 refills | Status: AC

## 2023-06-02 NOTE — Discharge Instructions (Addendum)
Advised patient to take medication as directed with food to completion.  Encouraged increase daily water intake to 64 ounces per day while taking this medication.  Advised patient if symptoms worsen and/or unresolved please follow-up with PCP, [facility] (contact information provided with his AVS), or here for further evaluation.

## 2023-06-02 NOTE — ED Triage Notes (Signed)
Pt reports decreased hearing worse on Lt than RT.

## 2023-06-02 NOTE — ED Provider Notes (Signed)
[facility]    CSN: [id number] Arrival date & time: 06/02/23  1205      History   Chief Complaint Chief Complaint  Patient presents with   decreased hearing    HPI Devon Short is a 67 y.o. male.   HPI 67 year old male presents with decreased hearing reports left is worse than the right.  PMH significant for CAD (s/p MI), HTN and history of colonic polyps  Past Medical History:  Diagnosis Date   Heart attack [facility])     Patient Active Problem List   Diagnosis Date Noted   OTHER AND UNSPECIFIED HYPERLIPIDEMIA 04/12/2008   ERECTILE DYSFUNCTION 12/30/2007   RIB PAIN, LEFT SIDED 12/30/2007   COLONIC POLYPS, BENIGN, HX OF 12/30/2007    History reviewed. No pertinent surgical history.     Home Medications    Prior to Admission medications   Medication Sig Start Date End Date Taking? Authorizing Provider  amoxicillin-clavulanate (AUGMENTIN) 875-125 MG tablet Take 1 tablet by mouth 2 (two) times daily for 10 days. 06/02/23 06/12/23 Yes Trevor Iha, FNP  atorvastatin (LIPITOR) 40 MG tablet Take 40 mg by mouth daily.   Yes [provider]  carvedilol (COREG) 12.5 MG tablet Take 12.5 mg by mouth 2 (two) times daily with a meal.   Yes [provider]  isosorbide mononitrate (IMDUR) 60 MG 24 hr tablet Take 60 mg by mouth daily.   Yes [provider]  lisinopril (ZESTRIL) 40 MG tablet Take 40 mg by mouth daily.   Yes [provider]  Omega-3 1000 MG CAPS Take by mouth.   Yes [provider]    Family History Family History  Problem Relation Age of Onset   Cancer Father     Social History Social History   Tobacco Use   Smoking status: Former    Current packs/day: 0.00    Types: Cigarettes    Start date: 01/04/1973    Quit date: 01/04/2018    Years since quitting: 5.4   Smokeless tobacco: Never  Vaping Use   Vaping status: Never Used  Substance Use Topics   Alcohol use: Not Currently   Drug use: Never      Allergies   Atorvastatin and Neomycin   Review of Systems Review of Systems  HENT:  Positive for ear pain and hearing loss.   All other systems reviewed and are negative.    Physical Exam Triage Vital Signs ED Triage Vitals  Encounter Vitals Group     BP 06/02/23 1259 116/68     Systolic BP Percentile --      Diastolic BP Percentile --      Pulse Rate 06/02/23 1259 (!) 53     Resp 06/02/23 1259 18     Temp 06/02/23 1259 98.1 F (36.7 C)     Temp src --      SpO2 06/02/23 1259 95 %     Weight --      Height --      Head Circumference --      Peak Flow --      Pain Score 06/02/23 1253 0     Pain Loc --      Pain Education --      Exclude from Growth Chart --    No data found.  Updated Vital Signs BP 116/68   Pulse (!) 53   Temp 98.1 F (36.7 C)   Resp 18   SpO2 95%    Physical Exam Vitals  and nursing note reviewed.  Constitutional:      Appearance: Normal appearance. He is normal weight.  HENT:     Right Ear: External ear normal.     Left Ear: External ear normal.     Mouth/Throat:     Mouth: Mucous membranes are moist.     Pharynx: Oropharynx is clear.  Eyes:     Extraocular Movements: Extraocular movements intact.     Conjunctiva/sclera: Conjunctivae normal.     Pupils: Pupils are equal, round, and reactive to light.  Cardiovascular:     Rate and Rhythm: Normal rate and regular rhythm.     Pulses: Normal pulses.     Heart sounds: Normal heart sounds.  Pulmonary:     Effort: Pulmonary effort is normal.     Breath sounds: Normal breath sounds. No wheezing, rhonchi or rales.  Musculoskeletal:     Cervical back: Normal range of motion and neck supple.  Skin:    General: Skin is warm and dry.  Neurological:     General: No focal deficit present.     Mental Status: He is alert and oriented to person, place, and time. Mental status is at baseline.  Psychiatric:        Mood and Affect: Mood normal.        Behavior: Behavior normal.       UC Treatments / Results  Labs (all labs ordered are listed, but only abnormal results are displayed) Labs Reviewed - No data to display  EKG   Radiology No results found.  Procedures Procedures (including critical care time)  Medications Ordered in UC Medications - No data to display  Initial Impression / Assessment and Plan / UC Course  I have reviewed the triage vital signs and the nursing notes.  Pertinent labs & imaging results that were available during my care of the patient were reviewed by me and considered in my medical decision making (see chart for details).     MDM: 1.  Acute left otitis media-Rx'd Augmentin 875/125 mg tablet: Take 1 tablet twice daily for the next 10 days; 2.  Decreased hearing of left ear-Advised patient follow-up with [facility] health outpatient audiology for further evaluation.  Contact information provided with this AVS today. Advised patient to take medication as directed with food to completion.  Encouraged increase daily water intake to 64 ounces per day while taking this medication.  Advised patient if symptoms worsen and/or unresolved please follow-up with PCP, [facility] (contact information provided with his AVS), or here for further evaluation.  Patient discharged home, hemodynamically stable. Final Clinical Impressions(s) / UC Diagnoses   Final diagnoses:  Acute left otitis media  Decreased hearing of left ear     Discharge Instructions      Advised patient to take medication as directed with food to completion.  Encouraged increase daily water intake to 64 ounces per day while taking this medication.  Advised patient if symptoms worsen and/or unresolved please follow-up with PCP, [facility] (contact information provided with his AVS), or here for further evaluation.     ED Prescriptions     Medication Sig Dispense Auth. Provider   amoxicillin-clavulanate (AUGMENTIN) 875-125 MG tablet Take 1 tablet by  mouth 2 (two) times daily for 10 days. 20 tablet Trevor Iha, FNP      PDMP not reviewed this encounter.   Trevor Iha, FNP 06/02/23 1342
